# Patient Record
Sex: Female | Born: 1955
Health system: Southern US, Community
[De-identification: ages and names within clinical notes are randomized; demographics above are authoritative.]

## PROBLEM LIST (undated history)

## (undated) DIAGNOSIS — M21379 Foot drop, unspecified foot: Secondary | ICD-10-CM

## (undated) DIAGNOSIS — K5090 Crohn's disease, unspecified, without complications: Secondary | ICD-10-CM

## (undated) DIAGNOSIS — K859 Acute pancreatitis without necrosis or infection, unspecified: Secondary | ICD-10-CM

## (undated) DIAGNOSIS — M1990 Unspecified osteoarthritis, unspecified site: Secondary | ICD-10-CM

## (undated) DIAGNOSIS — G43909 Migraine, unspecified, not intractable, without status migrainosus: Secondary | ICD-10-CM

## (undated) DIAGNOSIS — B029 Zoster without complications: Secondary | ICD-10-CM

## (undated) DIAGNOSIS — Z7952 Long term (current) use of systemic steroids: Secondary | ICD-10-CM

## (undated) HISTORY — PX: KNEE SURGERY: SHX244

## (undated) HISTORY — PX: BOWEL RESECTION: SHX1257

## (undated) HISTORY — PX: OTHER SURGICAL HISTORY: SHX169

## (undated) HISTORY — PX: SHOULDER SURGERY: SHX246

## (undated) HISTORY — PX: COLONOSCOPY: SHX174

## (undated) HISTORY — DX: Zoster without complications: B02.9

---

## 1999-07-29 ENCOUNTER — Other Ambulatory Visit: Admission: RE | Admit: 1999-07-29 | Discharge: 1999-07-29 | Payer: Self-pay | Admitting: Gynecology

## 2000-03-17 ENCOUNTER — Other Ambulatory Visit: Admission: RE | Admit: 2000-03-17 | Discharge: 2000-03-17 | Payer: Self-pay | Admitting: Gynecology

## 2000-12-06 ENCOUNTER — Encounter: Payer: Self-pay | Admitting: Orthopedic Surgery

## 2000-12-09 ENCOUNTER — Inpatient Hospital Stay (HOSPITAL_COMMUNITY): Admission: RE | Admit: 2000-12-09 | Discharge: 2000-12-17 | Payer: Self-pay | Admitting: Orthopedic Surgery

## 2000-12-09 ENCOUNTER — Encounter: Payer: Self-pay | Admitting: Orthopedic Surgery

## 2001-02-28 ENCOUNTER — Encounter: Payer: Self-pay | Admitting: Orthopedic Surgery

## 2001-02-28 ENCOUNTER — Ambulatory Visit (HOSPITAL_COMMUNITY): Admission: RE | Admit: 2001-02-28 | Discharge: 2001-02-28 | Payer: Self-pay | Admitting: Orthopedic Surgery

## 2001-05-18 ENCOUNTER — Encounter: Admission: RE | Admit: 2001-05-18 | Discharge: 2001-05-18 | Payer: Self-pay | Admitting: Neurology

## 2001-05-18 ENCOUNTER — Encounter: Payer: Self-pay | Admitting: Neurology

## 2003-01-08 ENCOUNTER — Other Ambulatory Visit: Admission: RE | Admit: 2003-01-08 | Discharge: 2003-01-08 | Payer: Self-pay | Admitting: *Deleted

## 2003-04-13 ENCOUNTER — Encounter: Admission: RE | Admit: 2003-04-13 | Discharge: 2003-04-13 | Payer: Self-pay | Admitting: Rheumatology

## 2003-04-13 ENCOUNTER — Inpatient Hospital Stay (HOSPITAL_COMMUNITY): Admission: AD | Admit: 2003-04-13 | Discharge: 2003-04-18 | Payer: Self-pay | Admitting: Internal Medicine

## 2003-04-13 ENCOUNTER — Encounter: Payer: Self-pay | Admitting: Rheumatology

## 2003-04-14 ENCOUNTER — Encounter: Payer: Self-pay | Admitting: Internal Medicine

## 2003-05-17 ENCOUNTER — Ambulatory Visit (HOSPITAL_BASED_OUTPATIENT_CLINIC_OR_DEPARTMENT_OTHER): Admission: RE | Admit: 2003-05-17 | Discharge: 2003-05-17 | Payer: Self-pay | Admitting: Orthopedic Surgery

## 2003-05-17 ENCOUNTER — Encounter (INDEPENDENT_AMBULATORY_CARE_PROVIDER_SITE_OTHER): Payer: Self-pay | Admitting: *Deleted

## 2003-05-25 ENCOUNTER — Encounter: Admission: RE | Admit: 2003-05-25 | Discharge: 2003-05-25 | Payer: Self-pay | Admitting: Internal Medicine

## 2004-02-26 ENCOUNTER — Other Ambulatory Visit: Admission: RE | Admit: 2004-02-26 | Discharge: 2004-02-26 | Payer: Self-pay | Admitting: Obstetrics and Gynecology

## 2005-07-19 ENCOUNTER — Emergency Department (HOSPITAL_COMMUNITY): Admission: EM | Admit: 2005-07-19 | Discharge: 2005-07-19 | Payer: Self-pay | Admitting: Emergency Medicine

## 2006-01-19 ENCOUNTER — Other Ambulatory Visit: Admission: RE | Admit: 2006-01-19 | Discharge: 2006-01-19 | Payer: Self-pay | Admitting: Obstetrics and Gynecology

## 2006-06-07 ENCOUNTER — Inpatient Hospital Stay (HOSPITAL_COMMUNITY): Admission: RE | Admit: 2006-06-07 | Discharge: 2006-06-11 | Payer: Self-pay | Admitting: *Deleted

## 2006-11-17 ENCOUNTER — Observation Stay (HOSPITAL_COMMUNITY): Admission: EM | Admit: 2006-11-17 | Discharge: 2006-11-18 | Payer: Self-pay | Admitting: Emergency Medicine

## 2006-12-08 ENCOUNTER — Encounter: Admission: RE | Admit: 2006-12-08 | Discharge: 2006-12-08 | Payer: Self-pay | Admitting: Gastroenterology

## 2007-03-01 ENCOUNTER — Encounter: Admission: RE | Admit: 2007-03-01 | Discharge: 2007-03-01 | Payer: Self-pay | Admitting: Rheumatology

## 2007-08-14 ENCOUNTER — Emergency Department (HOSPITAL_COMMUNITY): Admission: EM | Admit: 2007-08-14 | Discharge: 2007-08-14 | Payer: Self-pay | Admitting: Emergency Medicine

## 2008-03-20 ENCOUNTER — Other Ambulatory Visit: Admission: RE | Admit: 2008-03-20 | Discharge: 2008-03-20 | Payer: Self-pay | Admitting: Obstetrics and Gynecology

## 2008-07-18 ENCOUNTER — Ambulatory Visit (HOSPITAL_COMMUNITY): Admission: RE | Admit: 2008-07-18 | Discharge: 2008-07-18 | Payer: Self-pay | Admitting: Obstetrics and Gynecology

## 2008-07-18 ENCOUNTER — Encounter (INDEPENDENT_AMBULATORY_CARE_PROVIDER_SITE_OTHER): Payer: Self-pay | Admitting: Obstetrics and Gynecology

## 2008-08-01 ENCOUNTER — Emergency Department (HOSPITAL_COMMUNITY): Admission: EM | Admit: 2008-08-01 | Discharge: 2008-08-01 | Payer: Self-pay | Admitting: Emergency Medicine

## 2008-09-21 ENCOUNTER — Encounter: Admission: RE | Admit: 2008-09-21 | Discharge: 2008-09-21 | Payer: Self-pay | Admitting: Orthopedic Surgery

## 2010-09-21 ENCOUNTER — Encounter: Payer: Self-pay | Admitting: Obstetrics and Gynecology

## 2011-01-13 NOTE — Op Note (Signed)
NAMEJORDIE, [name]              ACCOUNT NO.:  [id number]   MEDICAL RECORD NO.:  [id number]          PATIENT TYPE:  AMB   LOCATION:  SDC                           FACILITY:  [facility]   PHYSICIAN:  Charles A. Delcambre, MDDATE OF BIRTH:  01-22-1956   DATE OF PROCEDURE:  07/18/2008  DATE OF DISCHARGE:                               OPERATIVE REPORT   PREOPERATIVE DIAGNOSES:  1. Postmenopausal bleeding.  2. Endometrial mass.  3. Vulvar and thigh lesions.   POSTOPERATIVE DIAGNOSES:  1. Postmenopausal bleeding.  2. Endometrial mass.  3. Vulvar and thigh lesions.  4. Endometrial polyp.   PROCEDURES:  1. Hysteroscopy.  2. Dilation and curettage.  3. Polypectomy.  4. Paracervical block.  5. Vulvar lesion resection.  6. Mole resection, right upper thigh.   SURGEON:  Charles A. Delcambre, MD   ASSISTANT:  None.   COMPLICATIONS:  None.   ESTIMATED BLOOD LOSS:  25 mL.   ANESTHESIA:  General, via the endotracheal route.   Instrument, sponge, and needle counts were correct x2.   FINDINGS:  Multiple endometrial polyps on the right approximately 4 cm  down from the groin junction from the vulva, was an irregular dark mole  removed per my judgment after further evaluation in the OR.  There was a  firm inferior mons area lesion that was bothersome to her and this was  removed by plan.   DESCRIPTION OF PROCEDURE:  The patient was taken to the operating room  and placed in supine position.  General anesthetic was induced without  difficulty.  She was then placed in dorsal lithotomy position.  The  wound was sterilely prepped and draped.  A weighted speculum was placed  in the vagina.  Tenaculum was used on the cervix.  Paracervical block  with 16 mL divided equally.  Plain Marcaine 0.25% were placed at 4 and 8  o'clock.  There was no evidence of intravascular location of injection.  Sound was to 8 cm.  The uterus was anteverted.  Hanks dilators were used  to dilate up enough to get  5-mm hysteroscope in.  Findings were noted  above.  Hysteroscope was withdrawn.  Curettes were used to remove the  polyps and then with polyps forceps and then circumferential curetting  was done until the gritty feeling was throughout.  There was no evidence  of perforation.  Hysteroscopy was discontinued.  D&C had adequate  hemostasis after curetting.  The tenaculum was removed and there was a  sign of good hemostasis.  Attention was turned to the left mons area.  This area was shaved about a square centimeter and 1% lidocaine with  epinephrine 3 mL was injected subcutaneously.  The lesion was removed in  a circular fashion, then a 3-0 Prolene was placed in a single stitch to  achieve hemostasis.  Bacitracin ointment was placed on this area.  Attention was then turned to the right upper thigh, a half-moon-shaped  irregular pigmented lesion was resected after 3 mL of 1% lidocaine with  epinephrine were injected subcutaneously.  The 3-0 Prolene was used to  stitch the incised area  with good results.  Hemostasis on both sites  were excellent.  Band-Aid was applied on the thigh.  The patient was  awakened and taken to recovery with physician in attendance having  tolerated the procedure well.      Charles A. Sydnee Cabal, MD  Electronically Signed     CAD/MEDQ  D:  07/18/2008  T:  07/19/2008  Job:  [id number]

## 2011-01-13 NOTE — H&P (Signed)
[name], [name]              ACCOUNT NO.:  [id number]   MEDICAL RECORD NO.:  [id number]          PATIENT TYPE:  AMB   LOCATION:  SDC                           FACILITY:  [facility]   PHYSICIAN:  Charles A. Delcambre, MDDATE OF BIRTH:  12-14-1955   DATE OF ADMISSION:  DATE OF DISCHARGE:                              HISTORY & PHYSICAL   A 55 year old para 2-0-0-2 comes in today and was noted to have  irregular bleeding and I felt to be perimenopausal.  Ultrasound was done  and this showed a hyperechoic mass on the anterior wall of 5 x 4 mm,  total thickness was 9 mm.  Pap has been negative, July 2009.  She has an  endometrial biopsy in that we are going to go and do hysteroscopy, D&C  at the same time.   PAST MEDICAL HISTORY:  Spondyloarthropathy/Crohn disease, peripheral  joint disease, chronic back pain, monoclonal gammopathy, migraine  headaches.   SURGICAL HISTORY:  Bilateral hip replacement x2 each, 2 hip replacements  on the left, 2 hip replacements on the right for severe DJD,  paresthesias on the right inner and outer of her leg.  She wears a brace  in this regard with drop foot.  Arthroscopic knee and then open knee to  the right for arthritis in 1995, small bowel obstruction or fistula  resulting in 6 inches of segment of small bowel resected.   MEDICATIONS:  Darvocet for hip pain, Imitrex IM for headaches.   Crohn disease has been silent for the last 8-10 years.   ADDITIONAL MEDICAL HISTORY:  Opening cause pancreatitis.   ALLERGIES:  No known drug allergies.   SOCIAL HISTORY:  No tobacco, ethanol, or drug use or STD exposure.  The  patient is married, monogamous relationship with her husband.   FAMILY HISTORY:  Father 63, asthma and hypertension.  Mother 67 and did  not know if she is still living with uterine cancer.  She was felt to be  NED at that time and also had hypertension.   REVIEW OF SYSTEMS:  No fever, chills, rashes, lesions, headaches,  dizziness,  seasonal allergies, chest pain, shortness of breath,  wheezing, diarrhea, constipation, bleeding, melena, hematochezia,  urgency, frequency, dysuria, incontinence, hematuria, galactorrhea,  emotional changes.   PHYSICAL EXAMINATION:  GENERAL:  Alert and oriented x3.  VITAL SIGNS:  Blood pressure 130/90, respirations 20, pulse 76,  afebrile.  HEART:  Regular rate and rhythm.  LUNGS:  Clear bilaterally.  ABDOMEN: Soft, flat, nontender.  PELVIC:  Uterus nonenlarged.  Vulva and vagina normal.  Adnexal regions,  ovaries nonpalpable, but exam is nontender.   ASSESSMENT:  Perimenopausal irregular bleeding with endometrial mass on  sonohysterogram.   PLAN:  Hysteroscopy, D&C.  She accepts risks of infection, bleeding,  bowel and bladder damage, uterine perforation, failure to control  bleeding, blood product risk, hepatitis and HIV exposure.  We will plan  hysteroscopy, D&C on July 18, 2008.  She will remain n.p.o. past  0600.  Preop CBC, CMET, and serum pregnancy test will be done as well.      Charles A. Sydnee Cabal, MD  Electronically Signed     CAD/MEDQ  D:  07/06/2008  T:  07/07/2008  Job:  [id number]

## 2011-01-16 NOTE — Discharge Summary (Signed)
[place]  Patient:    [name], [name]                     MRN: [id number] Adm. Date:  [id number] Disc. Date: [id number] Attending:  Cornell Barman Dictator:   Jamelle Rushing, P.A.                           Discharge Summary  Over her course of time, she did continue to improve with her appetite and overall pain management and she did also have some slight initial improvement in the sensation about her foot.  Ultimately on date of discharge, her loss of sensation included her very forefoot and along her later aspect of her foot to her ankle.  She did have sensation in the plantar aspect of her foot and up into her arch and the very medial aspect of her ankle.  She still continued to have no motor abilities of her forefoot.  The patient has been having some intermittent aches and pains throughout her foot.  She has noted that she has has had some burning and tingling sensations down her lower leg into her foot when she is up and ambulating and she did note on the previous night that she was woke in the middle of the night with a sensation of a severe ingrown toenail of her right great toe.  Otherwise, there were no other complications or problems throughout this patients hospitalization and she was felt to be stable and ready to be discharged on postop day #8.  Arrangements were made for the patient to have a home health aide for activities of daily living. She was also to have a home health R.N. for protime draws.  LABORATORY DATA AND X-RAY FINDINGS:  On April 14, CBC with WBC 10.5, hemoglobin 9.7, hematocrit 28.1, platelets 263.  On April 18, PT 18.9 and 2.0 INR.  Routine chemistries on April 13, showed sodium 134, potassium 3.3, glucose 121, creatinine 0.4.  All other values normal.  Routine urinalysis on admission showed a few epithelial cells and a moderate amount of leukocyte esterase with no bacteria noted.  Routine cultures of the  hip during surgical procedure eventually had no WBCs or organisms noted and no growth after several days.  The patient did receive a total of two units of packed red blood cells.  EKG on admission was normal sinus rhythm with a first-degree AV block with right axis deviation, right ventricular hypertrophy with repolarization abnormality at 80 beats per minute.  DISCHARGE MEDICATIONS:  1. Colace 100 mg p.o. b.i.d.  2. Claritin 10 mg p.o. q.d.  3. Azulfidine 500 mg p.o. b.i.d.  4. Os-Cal 500 plus D two tablets p.o. q.d.  5. Potassium chloride 20 mEq p.o. b.i.d.  6. Topamax 300 mg p.o. q.h.s.  7. OxyContin 30 mg p.o. q.12h.  8. Robaxin 500 mg p.o. q.6-8h. p.r.n. spasms.  9. Coumadin 1 mg p.o. q.d. 10. Percocet one to two tablets every three to four hours p.r.n. breakthrough     pain.  SPECIAL INSTRUCTIONS:  Continue routine home medications OxyContin CR a total of 30 mg p.o. every 12 hours, Percocet one to two tablets every three to four hours for breakthrough pain if needed, Coumadin 1 mg tablet a day unless changed by [place] pharmacist.  Home health R.N. for protime.  Hospital bed. Home health physical therapy and occupational therapy to be held until increased  weightbearing status.  ACTIVITY:  Bed to chair only, nonweightbearing right leg.  May touchdown right leg to balance for shower with the use of a walker.  DIET:  No restrictions.  WOUND CARE:  Check daily for infection.  If any increased redness, swelling, pain or foul discharge, call physician of present.  FOLLOWUP:  The patient is to call for a follow-up appointment with Dr. Chaney Malling this following Wednesday.  CONDITION ON DISCHARGE:  Good. DD:  12/17/00 TD:  12/18/00 Job: [id number]

## 2011-01-16 NOTE — Op Note (Signed)
   NAMEBILLIE, [name]                        ACCOUNT NO.:  [id number]   MEDICAL RECORD NO.:  [id number]                   PATIENT TYPE:  AMB   LOCATION:  DSC                                  FACILITY:  [facility]   PHYSICIAN:  Rodney A. Chaney Malling, M.D.           DATE OF BIRTH:  April 12, 1956   DATE OF PROCEDURE:  05/17/2003  DATE OF DISCHARGE:                                 OPERATIVE REPORT   PREOPERATIVE DIAGNOSIS:  Osteomyelitis, right great toe.   POSTOPERATIVE DIAGNOSIS:  Osteomyelitis, right great toe.   OPERATION PERFORMED:  Incision and drainage of right great toe and  debridement of terminal phalanx.   SURGEON:  Lenard Galloway. Chaney Malling, M.D.   ANESTHESIA:  Local.   DESCRIPTION OF PROCEDURE:  The patient was placed on the operating table in  supine position with a pneumatic tourniquet about the right upper thigh.  The right lower extremity and foot was prepped with DuraPrep and draped out  in the usual manner.  The foot was then wrapped out with an Esmarch.  A U-  shaped incision made over the distal end of the right great toe.  This  started at the base of the nail on the medial side and extended to the base  of the nail on the lateral side at the level of the terminal phalanx.  Using  very careful sharp dissection and loupe magnification, the nail bed was  elevated off the bone and the plantar flap was dissected off the bone.  Excellent exposure of the terminal phalanx was achieved.  No significant  abnormality was apparent.  The bone did not appear to be soft or degraded.  Aerobic and anaerobic cultures were taken.  The terminal phalanx was then  amputated with a bone cutting rongeur.  Cultures were taken in the marrow  cavity for both aerobic and anaerobic cultures.  A portion of the adjacent  soft tissue was also sent of for aerobic cultures.  Bone was sent for  pathologic evaluation.  No obvious draining sinuses were seen.  The base of  the nail was removed.  No other  evidence of infection was seen.  The wound  was left wide open.  Sterile dressings were applied.  The patient was  started on Ancef until cultures are available to determine final antibiotic  treatment. The patient then returned to the recovery room in excellent  condition.  Technically, this procedure went extremely well.   DRAINS:  Wound left wide open and packed open.   COMPLICATIONS:  None.                                               Rodney A. Chaney Malling, M.D.    RAM/MEDQ  D:  05/17/2003  T:  05/17/2003  Job:  [id number]

## 2011-01-16 NOTE — H&P (Signed)
NAMETANZIE, ROTHSCHILD                        ACCOUNT NO.:  [id number]   MEDICAL RECORD NO.:  [id number]                   PATIENT TYPE:  INP   LOCATION:  5742                                 FACILITY:  [facility]   PHYSICIAN:  Georgann Housekeeper, M.D.                 DATE OF BIRTH:  05-07-1956   DATE OF ADMISSION:  04/13/2003  DATE OF DISCHARGE:                                HISTORY & PHYSICAL   CHIEF COMPLAINT:  Abdominal pain and nausea.   HISTORY OF PRESENT ILLNESS:  [name] is a 55 year old white female  patient of Dr. Lennox Pippins who has had, over the past five days, abdominal  pain in the upper quadrants and pain extending all the way down the right  portion of the back into the hip.  The pain in the lower back and hip has  resolved.  She had described her discomfort initially as an 8.  It has been  accompanied by fever as high as 102.  She presented to the office on  04/11/2003.  Examination at that time revealed no fever or abdominal pain.  The patient did have some chills.  An FUO workup was obtained with no source  found.  Decision was made to place her on doxycycline as she lives in a  wooded environment.  Fever never resolved despite around-the-clock Tylenol.  She was found to have a positive urine culture with greater than 100,000 E.  coli and was placed on Cipro today.  In the last 24 hours, the patient  developed worsening abdominal pain, mainly in the epigastric area.  She has  also developed nausea in the last few hours.  She had an abdominal  ultrasound done that showed mildly diffuse pancreatic enlargement with  questionable pancreatitis.  She is being admitted at this time.   REVIEW OF SYSTEMS:  Fever as high as 103 two days ago.  Temperature 101.5  this morning.  CHEST:  No pain or cough, no wheezing.  GI:  As above.  GU:  No UTI symptoms.  RECTAL:  No diarrhea.  Bowel movements have been regular.  EXTREMITIES:  No edema.   PAST MEDICAL HISTORY:  1.  Spondyloarthropathy, Crohn's disease.  2. Migraine headache.  3. Monoclonal gammopathy, uncertain significance.   PAST SURGICAL HISTORY:  1. History of tonsillectomy.  2. Surgery for resection of ileocolostomy and gastrostomy in 1994.  3. Surgery for abdominal adhesions in 1996.   SOCIAL HISTORY:  She is a nonsmoker, rarely drinks ETOH.   FAMILY HISTORY:  Diabetes and arthritis.   PHYSICAL EXAMINATION:  VITAL SIGNS:  Temperature in the office this morning  98.5, pulse 70, blood pressure 120/80, respirations 18.  GENERAL: Alert female patient, holding abdominal area and leaning forward in  the chair.  HEENT:  Eyes, ears, nose, and throat unremarkable.  NECK:  No JVD.  No carotid bruits auscultated.  HEART:  Normal S1 and  S2 without murmur or rub.  LUNGS:  Clear to auscultation.  ABDOMEN:  Soft and nondistended.  Bowel sounds are present in all four  quadrants.  She is tender in the epigastric area.  No obvious guarding or  rebounding.  GU: Deferred.  RECTAL:  Sphincter tone normal.  Stool is light brown.  EXTREMITIES:  No edema.  INTEGUMENT:  No rash.  NEUROLOGIC:  Nonfocal exam.   MEDICATIONS:  1. Topamax, ? dose daily.  2. Azulfidine 2 p.o. b.i.d.  3. Darvocet 1 p.o. t.i.d. p.r.n.   ALLERGIES:  No known drug allergies.   LABORATORY DATA:  Laboratory data from August 13, amylase 146, lipase 423,  BUN 5, creatinine 0.5, sodium 133, potassium 2.7, chloride 106, CO2 23,  calcium 9.1, total protein 7.1, albumin 3.6, total bilirubin 1.1, alkaline  phosphatase 92, AST 40, ALT 33.  WBC 7000.   Laboratory data from August 11 reveals hemoglobin 13.5, hematocrit 40.2, MCV  100.2, white cell count 9100 with neutrophils of 76.4%.  Urinalysis revealed  +2 leukocyte esterase, 5 to 10 white blood cells, rare red blood cells, and  moderate bacteria.  Urine cultures grew out greater than 100,000 E. coli.  Urine pregnancy test was negative.   IMPRESSION:  Abdominal pain, nausea.    PLAN:  Based on abdominal ultrasound, it seems this is most likely  pancreatis.  Since she develop significant nausea in the last few hours and  the abdominal pain has worsened, will admit.  Will make n.p.o. for now.  IV  fluids.  Phenergan and Demerol for pain. Will also obtain abdominal CT scan  and have GI follow.      Tamera C. Holli Humbles, M.D.    TCL/MEDQ  D:  04/13/2003  T:  04/14/2003  Job:  [id number]

## 2011-01-16 NOTE — Discharge Summary (Signed)
NAMEGIANAH, BATT              ACCOUNT NO.:  [id number]   MEDICAL RECORD NO.:  [id number]          PATIENT TYPE:  INP   LOCATION:  5159                         FACILITY:  [facility]   PHYSICIAN:  Thora Lance, M.D.  DATE OF BIRTH:  [birth year]   DATE OF ADMISSION:  06/07/2006  DATE OF DISCHARGE:  06/11/2006                                 DISCHARGE SUMMARY   This is a 55 year old female who presented with a complaint of nausea,  vomiting, and midabdominal pain the 4 days prior to admission.  Lab workup  showed lipase of 268.  Her abdominal ultrasound was negative for gallstones.   SIGNIFICANT PHYSICAL FINDINGS:  Temperature 98.6, heart rate 88, blood  pressure 130/70.  Abdomen had bowel sounds present, tender in the  epigastrium.  No rebound or guarding or hepatosplenomegaly.   LABORATORIES ON ADMISSION:  WBC 6.6, hemoglobin 11.0, platelets 223.  Chemistry:  Sodium 140, potassium 3.1, chloride 104, bicarbonate 29, glucose  113, BUN 2, creatinine 0.5, calcium 8.7, total protein 6.2, albumin 2.9, AST  30, ALT 40, alk phos 83, total bilirubin 0.5, amylase 160, lipase 312.  Urinalysis was negative.  Ultrasound of the abdomen was negative for  gallstones or other abnormality.   HOSPITAL COURSE:  The patient was admitted with acute pancreatitis of  unclear etiology.  She was seen by Dr. Madilyn Fireman of the GI service.  This was  the patient's second bout of pancreatitis, the other being in 2004.  She was  a modest drinker and had no evidence of biliary involvement.  She was  treated supportively with IV fluids, pain, medication, and antiemetics and  electrolyte repletion.  Her potassium was a little bit low and was repleted  and normal at discharge.  The patient had an upper endoscopy on October 09  which was normal.  She had a CT scan of the abdomen October 10 which showed  evidence of acute pancreatitis but no necrosis or pseudocyst or etiology for  a pancreatitis identified.  There was some  reactive lymphadenopathy.  The  patient's symptoms steadily improved, and she only had mild abdominal pain  without nausea or vomiting at discharge.  She was tolerating a low-fat diet  at that time.  The final impression of the GI series is pancreatitis,  question etiology, possibly secondary to the sulfa component of the  Azulfidine.  The Azulfidine was discontinued at admission and not restarted  at discharge.  The patient will follow up with Dr. Coral Spikes and Dr. Sherin Quarry  regarding further treatment of her arthritis and Crohn disease.   DISCHARGE DIAGNOSES:  1. Acute pancreatitis.  2. Crohn's disease.  3. Migraine headaches.  4. Spondyloarthropathy.  5. Monoclonal gammopathy.   PROCEDURES:  1. Esophagogastroduodenoscopy.  2. Abdominal ultrasound.  3. CT scan of the abdomen.   DISCHARGE MEDICATIONS:  1. Imitrex IM p.r.n.  2. Vitamins.  3. Vicodin 5/500 one or 2 q.6 p.r.n. pain.   DISPOSITION:  Discharged to home.   DIET:  Low fat diet.   ACTIVITY:  As tolerated.   FOLLOWUP:  In the next 2-4 weeks with Dr.  Levitin and as currently scheduled  with Sherin Quarry.           ______________________________  Thora Lance, M.D.     JJG/MEDQ  D:  06/11/2006  T:  06/11/2006  Job:  [id number]   cc:   Demetria Pore. Coral Spikes, M.D.  Tasia Catchings, M.D.  John C. Madilyn Fireman, M.D.

## 2011-01-16 NOTE — Op Note (Signed)
Jennifer, Burns              ACCOUNT NO.:  [id number]   MEDICAL RECORD NO.:  [id number]          PATIENT TYPE:  INP   LOCATION:  5159                         FACILITY:  [facility]   PHYSICIAN:  John C. Madilyn Fireman, M.D.    DATE OF BIRTH:  [birth year]   DATE OF PROCEDURE:  06/08/2006  DATE OF DISCHARGE:                                 OPERATIVE REPORT   INDICATIONS FOR PROCEDURE:  Second bout of pancreatitis of unknown etiology.  No evidence of alcohol or biliary etiology or any other specific  explanation.  The procedure is primarily to rule out duodenal Crohn's as she  has known Crohn's disease or the unlikely possibility of a penetrating  duodenal or gastric ulcer into the pancreas.   PROCEDURE:  The patient was placed in the left lateral decubitus position  and placed on the pulse monitor with continuous low-flow oxygen delivered by  nasal cannula.  She was sedated with 75 mcg of IV fentanyl and 6 mg of IV  Versed.  The Olympus video endoscope was advanced under direct vision into  the oropharynx and esophagus.  The esophagus was straight and of normal  caliber.  The squamocolumnar line was at 38 cm.  There was no visible hiatal  hernia, ring, stricture or other abnormality of the GE junction.  The  stomach was entered, and a small amount of liquid secretions were suctioned  from the fundus.  Retroflexed view of cardia was unremarkable.  The fundus,  body, antrum and pylorus all appeared normal.  The duodenum was entered.  Both bulb and second portion were well inspected and appeared to be within  normal limits.  The papilla of Vater was seen tangentially and appeared  normal.  There was clear amber bile in the vicinity.  The scope was then  withdrawn and the patient returned to the recovery room in stable condition.  She tolerated the procedure well.  There were no immediate complications.   IMPRESSION:  Totally normal study.   PLAN:  Will obtain an abdominal CT scan to assess  severity of pancreatitis  and rule out neoplasm, pseudocyst,  etc.           ______________________________  Everardo All. Madilyn Fireman, M.D.     JCH/MEDQ  D:  06/08/2006  T:  06/10/2006  Job:  [id number]   cc:   Demetria Pore. Coral Spikes, M.D.

## 2011-01-16 NOTE — Discharge Summary (Signed)
[name], [name]              ACCOUNT NO.:  [id number]   MEDICAL RECORD NO.:  [id number]          PATIENT TYPE:  INP   LOCATION:  5714                         FACILITY:  [facility]   PHYSICIAN:  Michelene Gardener, MD    DATE OF BIRTH:  [birth year]   DATE OF ADMISSION:  11/16/2006  DATE OF DISCHARGE:  11/18/2006                               DISCHARGE SUMMARY   DISCHARGE DIAGNOSIS:  1. Crohn's disease.  2. Hypokalemia.  3. Urinary tract infection.  4. Headache.   DISCHARGE MEDICATIONS:  The patient was not given medications because he  left against medical advice.   CONSULTATIONS:  None.   PROCEDURES:  None.   FOLLOW-UP APPOINTMENTS:  He was advised to follow up with his primary  physician.   COURSE OF HOSPITALIZATION:  This is 55 year old female who presented with  increasing abdominal pain and was admitted to the hospital for  evaluation of Crohn's disease.  The patient was kept n.p.o. and started  on IV fluids and pain medications.  Also started on antiemetics p.r.n.  GI consult was requested with low Dr. Sherin Quarry, who has been seeing the  patient as an outpatient.  The patient also had potassium of 3.2, which  was supplemented by potassium chloride.  Urine cultures were sent for  urinary tract infection, and the patient was given antibiotics for that.  The next day of admission, the patient insisted on going home. The risk  of leaving was explained to him.  The patient insisted on going, so he  signed against medical advice.   ASSESSMENT TIME:  40 minutes.      Michelene Gardener, MD  Electronically Signed     NAE/MEDQ  D:  03/09/2007  T:  03/10/2007  Job:  [id number]

## 2011-01-16 NOTE — H&P (Signed)
[name], CHRISTON              ACCOUNT NO.:  [id number]   MEDICAL RECORD NO.:  [id number]          PATIENT TYPE:  INP   LOCATION:  5159                         FACILITY:  [facility]   PHYSICIAN:  Thora Lance, M.D.  DATE OF BIRTH:  1956/04/01   DATE OF ADMISSION:  06/07/2006  DATE OF DISCHARGE:                                HISTORY & PHYSICAL   This is a 55 year old white female with a complaint of nausea, vomiting, and  middle abdominal pain radiating to the right upper quadrant since four days  ago.  Now it started as low back pain.   HISTORY OF PRESENT ILLNESS:  The patient was ill with the above symptoms  since Friday.  She was brought in for evaluation.  Lab work revealed a  lipase of 268 with a negative abdominal ultrasound for gallstones.  The  decision was to admit because of persistent nausea and vomiting.  She does  have a history of pancreatitis in 2004, with a negative ultrasound of the  gallbladder at that time.  It was felt they could not do an MRCP because of  her hip replacements.   PAST MEDICAL HISTORY:  1. Acute pancreatitis of uncertain etiology with a negative ultrasound of      the gallbladder, calcium and triglycerides normal as well in 2004,      taken care of by Dr. Sherin Quarry.  2. Crohn's disease with status post terminal ileal resection in 1994.  3. Hypokalemia.  4. Migraine headache.  5. Spondyloarthropathy on Azulfidine.  6. Monoclonal gammopathy.   DRUG ALLERGIES:  NKDA.   CURRENT MEDICATIONS:  1. Azulfidine 1,000 mg twice a day.  2. Multivitamin one a day.  3. Phenergan 25 mg tablet as needed.  4. Imitrex shot for migraine headache.   SURGERIES:  1. Colon resection due to Crohn's in 1994.  2. Bilateral hip replacements.  3. Arthroscopic surgery of both knees.   SOCIAL HISTORY:  No smoking or alcohol.   REVIEW OF SYSTEMS:  No chest pain, shortness of breath, cough, sputum  production.  She does have fever, epigastric and abdominal pain  radiating to  the right upper quadrant, nausea, no vomiting.   PHYSICAL EXAMINATION:  VITAL SIGNS:  WT 143, T 98.6, P 88, BP 130/70.  GENERAL:  Acutely ill.  SKIN:  Nonicteric.  HEENT:  Mouth:  Mildly dry mucous membranes.  NECK:  Supple with no masses or adenopathy or goiter.  HEART:  Regular rhythm with no murmurs, rubs, or gallops.  LUNGS:  Clear.  ABDOMEN:  Bowel sounds present all four quadrants.  Soft.  She is tender in  the epigastrium that radiates to the right upper quadrant with no rebound,  guarding, or hepatosplenomegaly.  She does have a midline surgical scar from  a previous surgery.  EXTREMITIES:  She has trace edema in her ankles.   ASSESSMENT:  Recurring pancreatitis, last in 2004 with unclear etiology.   PLAN:  Admit for further evaluation and treatment.  Dr. Sherin Quarry wanted to  consider ERCP for recurring symptoms to check for a congenital ductal  abnormality.  __________  consult with Dr. Kirby Funk.      Lavinia Sharps, N.P.    ______________________________  Thora Lance, M.D.    MAP/MEDQ  D:  06/07/2006  T:  06/08/2006  Job:  [id number]

## 2011-01-16 NOTE — H&P (Signed)
NAMEKATALEYAH, CARDUCCI NO.:  [id number]   MEDICAL RECORD NO.:  [id number]          PATIENT TYPE:  EMS   LOCATION:  MAJO                         FACILITY:  [facility]   PHYSICIAN:  Lucita Ferrara, MD         DATE OF BIRTH:  1956-02-15   DATE OF ADMISSION:  11/16/2006  DATE OF DISCHARGE:                              HISTORY & PHYSICAL   PRIMARY CARE DOCTOR:  Dr. Coral Spikes from [facility] at [place]  office.   GASTROENTEROLOGIST:  Dr. Tasia Catchings.   HISTORY OF PRESENT ILLNESS:  The patient is a 55 year old female with  past medical history significant for Crohn's colitis, migraine headaches  and pancreatitis.  The patient presents with chief complaint of severe  abdominal pain and cramping for the last week that is getting  progressively worse.  The patient states that the pain has not improved.  Pain has resulted in some nausea and vomiting.  The patient also feels  feverish, although fever is subjective, patient afebrile here.  Relevant  to the history of present illness, the patient had a history of  recurrent pancreatitis thought to be secondary to sulfa medications  which the patient was taking for Crohn's colitis; the patient has  stopped that medication.  She states that her symptoms currently are  similar to a previous episode of pancreatitis.  The patient denies any  other relevant review of systems, denies any cardiac-related chest pain,  shortness of breath or peripheral edema.  No focal neurological  symptoms.   The patient had colon resection due to Crohn's disease in 1994.   PAST MEDICAL HISTORY:  1. Crohn's colitis.  2. Migraine headaches.  3. Recurrent pancreatitis; etiology of pancreatitis is unknown and it      was not thought to be secondary to gallbladder disease, calcium or      triglycerides.  4. History of hypokalemia.  5. History of spondyloarthropathy.  6. History of monoclonal gammopathy.   PAST SURGICAL HISTORY:  1. Colon  resection due to Crohn's disease in 1994.  2. Bilateral hip replacements.  3. Arthroscopic surgery of both knees.   ALLERGIES:  No known drug allergies.   CURRENT MEDICATIONS:  1. Darvocet-N 100 one tab q.6 h. p.r.n.  2. Imitrex shot for migraine headaches p.r.n.  3. Phenergan 25 mg as needed.  4. Multivitamins as needed.   PHYSICAL EXAMINATION:  GENERAL:  The patient looks acutely ill.  She is  in no acute distress or respiratory distress.  VITAL SIGNS:  Blood pressure is 126/63, pulse is 86, respirations 18 and  pulse oximetry 100% on room air.  HEENT:  Normocephalic and atraumatic.  Sclerae anicteric.  Mucous  membranes are moist.  NECK:  Supple.  No JVD.  No carotid bruits.  HEART:  S1 and S2.  Regular rate and rhythm.  No murmurs, rubs or  clicks.  LUNGS:  Clear to auscultation bilaterally.  No rhonchi, wheezes or  rales.  ABDOMEN:  Bowel sounds present in all 4 quadrants.  Tender in  epigastrium that radiates to the right upper quadrant.  No rebound  guarding.  No hepatosplenomegaly.  There is a midline surgical scar from  previous surgery.  EXTREMITIES:  No clubbing, cyanosis or edema.  NEUROLOGIC:  Alert and oriented x3.  Cranial nerves II-XII grossly  intact.   LABORATORY DATA:  White count 11.7, hemoglobin 14.7, hematocrit 42.3,  platelets 266,000.  Sodium 136, potassium 2.2, CO2 17, BUN 6, creatinine  0.54, lipase 24.   CT of the abdomen shows mucosal wall thickening and with mild  inflammatory changes consistent with active Crohn's disease changes in  the distal ileum, small amount of free fluid.   ASSESSMENT AND PLAN:  This is a 55 year old female with active Crohn's  exacerbation.  The patient will be admitted to the hospital for  intravenous hydration and pain management.  Gastroenterology will be  consulted.  Pain will be controlled with Dilaudid every 4-6 hours p.r.n.  Electrolytes and potassium will be repleted.  I am going to start Reglan  or Zofran for  nausea and vomiting.  The patient is hemodynamically  stable.  The plans and procedure of this admission have been explained  to the patient and the patient understands.      Lucita Ferrara, MD  Electronically Signed     RR/MEDQ  D:  11/17/2006  T:  11/17/2006  Job:  [id number]

## 2011-01-16 NOTE — Op Note (Signed)
[place]. [facility]  Patient:    [name], [name]                     MRN: [id number] Proc. Date: 12/09/00 Adm. 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  OPERATION PERFORMED:  Revision of acetabulum of right hip with ____________ type bone graft with femoral head fixed with 4 mm cannulated screws, deep profile acetabular solution system shell, 64 mm outside diameter, 54 mm liner with a 64 mm pinnacle sector acetabular cup with a Duraloc constrained liner and Solutions stem screws around periphery of constrained liner, three screws.  SURGEON:  Lenard Galloway. Chaney Malling, M.D.  ASSISTANT: 1. Claude Manges. Cleophas Dunker, M.D. 2. Jamelle Rushing, P.A.  ANESTHESIA:  General.  DESCRIPTION OF PROCEDURE:  After satisfactory oral endotracheal anesthesia, the patient was placed on the operating table in full lateral position with the right hip up.  The right lower extremity was prepped with DuraPrep and draped out in the usual manner.  Incision made through the previous Southern incision.  Skin edges were retracted.  Bleeders were coagulated.  Tensor fascia lata was split.  With the hip internally rotated, posterior aspect of the hip was approached.  Scar tissue over the posterior aspect of the hip was carefully lifted up.  The hip joint itself was opened and clear-appearing fluid was seen.  Cultures were taken.  There was a great deal of localized synovitis and scar tissue and this was excised.  This gave excellent access to the bipolar.  The bipolar hip could be easily dislocated.  There was a fair amount of fibrous tissue in the acetabulum.  This was then stripped out with a series of curets.  There was a large  defect in the posterior acetabular wall. Inspection of the proximal femoral component showed the femoral component to be fixed very firmly with excellent bony ingrowth.  This was a fixed head femoral component.  The acetabulum was reamed up to a 62 mm outside diameter and a pore-coated acetabular cup was inserted.  Trials were inserted.  The hip was unstable.  Forward flexioning was difficult to achieve because of a large defect posteriorly and it was felt that a ____________ type femoral head graft was necessary to build up to the posterior acetabular wall.  The femoral head was obtained from the bone bank.  The graft was then fashioned with a saw.  The graft was then inserted into the posterior acetabular lip and seated very nicely in the posterior acetabulum.  This reconstructed the posterior wall beautifully.  Drill holes were made and two 4 mm cannulated screws were used to fix to the pelvis itself.  Excellent fixation was achieved.  Another portion of the femoral head was then placed slightly distally and fixed to the acetabulum with another cannulated 4 mm screw.  Excellent fixation of this graft was also achieved.  The reamer was then reinserted in the acetabulum and the ____________ graft was contoured to accept a cup.  At this point it was felt that the ____________ acetabular cup which would displace the hip laterally to add to stability.  His neck length  on the femoral component was fixed and unable to be changed.  The ____________ acetabulum was put in place and a trial liner was inserted.  Several positions were used and ____________ forward flexion and abduction and fairly good stability was achieved.  Bone graft which had been ground up was then placed medially in the acetabulum. The ____________  acetabular cup was then put back in place and affixed with three peripheral screws.  Fairly good fixation was achieved.  The trial liner was then reinserted in the acetabulum and  the hip was reduced once again.  In full flexion, better than 90 degrees and full abduction and internal rotation this tended to slide out and it was felt that a constrained liner was necessary to prevent dislocation.  Throughout the procedure, the hip was irrigated with copious amounts of antibiotic solution.  The constrained liner was then inserted in the ____________ acetabular cup.  The ring had been placed around the neck of the femur.  Once the liner was reduced, the head was reduced in the poly liner and the ring was snapped through it in place. Excellent stability was achieved.  Good fixation of the acetabular cup was achieved.  Excellent positioning and capture by the posterior ____________ bone grafts, was very nice.  The entire posterior acetabular wall was reconstructed very nicely.  Hemovac drain was then placed in the wound. Posterior hip rotators were reattached.  Tensor fascia lata was closed with heavy Tycron sutures.  Vicryl was used to close the subcutaneous tissues and stainless steel staples used to close the skin.  This was an extremely complex extended procedure.  Technically, this went extremely well.  COMPLICATIONS:  None. DD:  12/09/00 TD:  12/09/00 Job: [id number]

## 2011-01-16 NOTE — Discharge Summary (Signed)
[place]. [facility]  Patient:    [name], [name]                     MRN: [id number] Adm. Date:  [id number] Disc. Date: [id number] Attending:  Cornell Barman Dictator:   Jamelle Rushing, P.A.                           Discharge Summary  ADMISSION DIAGNOSES: 1. Right hip bipolar prosthesis with protrusio acetabulum. 2. Crohns disease. 3. History of migraines.  DISCHARGE DIAGNOSES: 1. Right hip revision for protrusio acetabulum and bipolar prosthesis to total    hip arthroplasty. 2. Sciatic nerve right hip palsy. 3. Crohns disease. 4. History of migraines.  HISTORY OF PRESENT ILLNESS:  This is a 55 year old white female with a history of bilateral total hip arthroplasties in the past for significant problems with bilateral hip arthritis.  The patient has developed moderate to severe right hip pain over the last year.  It has significantly worsened over the last four months.  The patient describes the pain as moderate to severe sharp pain with ambulation that worsens going up hills, up and down stairs, and long-term ambulation.  It is described primarily as a deep pain within her groin.  It does not radiate anywhere.  She does not find anything that significant improves her discomfort.  X-rays in the office reveal a bipolar arthroplasty with protrusio acetabulum.  ALLERGIES:  None.  CURRENT MEDICATIONS: 1. Azulfidine 500 mg p.o. b.i.d. 2. Darvocet p.r.n. 3. Phenergan 25 mg q.6h. p.r.n. 4. Topamax 100 mg p.o. q.h.s. 5. Fiorinal one tablet p.o. q.4h. p.r.n. 6. Zyrtec 100 mg p.o. q.d. 7. Caltrate 600 mg plus soy b.i.d. 8. Zomig 5 mg p.o. p.r.n.  SURGICAL PROCEDURES:  On December 09, 2000, the patient was taken to the OR by Thereasa Distance A. Chaney Malling, M.D.  Assistants were Claude Manges. Cleophas Dunker, M.D., and Jamelle Rushing, P.A.  Under general anesthesia, the operation performed was revision of the acetabulum of her right hip with donor-type bone graft  with femoral head fixated with 4 mm cannulated screws, deep profile acetabular solution system shell, 64 mm outside diameter, 54 mm liner with a 64 mm pinnacle sector acetabular cup with a Duraloc constraint and liner, and solution stem screws around the periphery of constrained liner with three screws.  Technically the procedure went very well.  The patient tolerated the procedure well with the exception of developing a sciatic palsy postoperatively, which was documented upon arrival in the recovery unit.  COMPLICATIONS:  Sciatic nerve palsy, right hip, with loss of sensation and motor from glove-type pattern from the ankle on down.  CONSULTS:  Routine consults for case management and pharmacy for Coumadin dosing for DVT prophylaxis.  HOSPITAL COURSE:  On December 09, 2000, the patient was admitted to [facility] under the care of [place] A. Chaney Malling, M.D., and Cammy Copa, M.D.  The patient underwent a right hip revision for acetabulum protrusio and conversion of bipolar to a total hip arthroplasty.  The patient did develop a right sciatic palsy which was noted upon leaving the OR suite and arrival at the recovery unit.  The patient did have a glove-like loss of sensation and motor activity from the ankle down on her right.  [place] A. Chaney Malling, M.D., and Claude Manges. Cleophas Dunker, M.D., both evaluated the patient. Dr. Chaney Malling had a discussion with Genene Churn. Love, M.D., and  it was felt that it was a possible stretch injury to the sciatic nerve which incurred intraoperatively.  The plan was to watch and wait and observe and monitor over its course.  The patients weightbearing status due to the extensive repair of her acetabulum and the use of femoral head bone graft and a constraint liner, it was felt that the patient should remain nonweightbearing and bed to chair elevation only for a possible four weeks to allow both bone growth and solidification of the  acetabulum.  The patient then had an eight-day postoperative course in which she had several minor issues that we dealt with.  Initially the patients INR did become hypertherapeutic to 4.5 on December 12, 2000, so she was held on Coumadin for four days and on December 16, 2000, her INR returned at 2 and the pharmacy was going to resume the Coumadin at 1 mg a day and continue to monitor.  No significant untoward effects due to this high INR.  The other problem was the patients significant problem with pain down the right lower extremity.  The patient initially was managed with high-dose PCA morphine, but she was converted over to OxyContin CR and Percocet for breakthrough pain and then initially weaned off of intermittent IV doses of morphine for rescue pain management.  On the day of discharge, the patient was virtually completely eliminated for any need of extra IV morphine, but she was at OxyContin CR 30 mg q.12h. and pretty much Percocet every four hours around the clock.  The patient also developed a significant amount of loss of appetite, but this did gradually improve over several days as she did increase her activity from bed to chair and did find other foods other than what is offered on the menu more appetizing.  The patient also did have some postoperative blood loss anemia. On December 10, 2000, her hemoglobin was 7.0 with a hematocrit of 20.4.  She was typed and crossed two units of packed red blood cells and transfused to 9.5 and 27.1, respectively.  Other than looking very pale, she had no other real significant symptoms other than DD:  12/17/00 TD:  12/18/00 Job: [id number]

## 2011-01-16 NOTE — Consult Note (Signed)
[name], [name]              ACCOUNT NO.:  [id number]   MEDICAL RECORD NO.:  [id number]          PATIENT TYPE:  INP   LOCATION:  5159                         FACILITY:  [facility]   PHYSICIAN:  John C. Madilyn Fireman, M.D.    DATE OF BIRTH:  1955-09-19   DATE OF CONSULTATION:  06/07/2006  DATE OF DISCHARGE:                                   CONSULTATION   We were asked to do a consult to evaluate the patient for pancreatitis to  see if she needs an ERCP by Dr. Demetria Pore. Levitin on June 07, 2006.   HISTORY OF PRESENT ILLNESS:  This patient complains of epigastric abdominal  pain that radiates to the right along with nausea and vomiting times 4 days.  She also has diarrhea that started this morning.  She denies melena,  hematochezia, hematemesis.  She reports that she had one martini Friday  night but usually drinks very little.  She occasionally takes Zyprexa to  sleep.  Other than that the only meds she uses are Azulfidine, Phenergan,  Imitrex and Darvocet.  She reports that she stopped taking Topamax 30 days  ago.  She had her initial episode of pancreatitis in 2004.  Amylase at that  time was 146.  Lipase was 423.  She had normal AST, ALT and in the interim  she has had normal calcium, normal triglycerides and normal LFTs.  This is  the second episode of pancreatitis.  Her lipase is currently 268.  She had  an ultrasound done today that was negative for any gallstones.  This patient  is unable to undergo MRCP due to hip placement bilaterally.   PAST MEDICAL HISTORY:  She had acute pancreatitis of unknown etiology in  2004.  She has Crohn's disease, status post terminal ileal resection in  1994.  Her Crohn's is well controlled with Azulfidine.  She has a history of  hypokalemia.  She has history of migraine headaches, says she gets 2-3 per  month.  History of spondyloarthropathy, and finally history of monoclonal  gammopathy.   SHE HAS NO KNOWN DRUG ALLERGIES.   CURRENT MEDICATIONS:  1. Azulfidine 500 mg q.i.d.  2. Imitrex IM.  3. MVI.  4. Phenergan 25 mg p.r.n.  5. Zyprexa p.r.n. insomnia.  6. Topamax which she stopped 30 days ago.   SURGERIES:  Terminal ileal resection in 1994, bilateral knee arthroscopy,  and bilateral hip replacement.   FAMILY HISTORY:  Negative for liver, pancreatic and colon cancer.   SOCIAL HISTORY:  She is a nonsmoker.  She drinks alcohol occasionally,  reports approximately 1 drink per month.  She lives with her husband.  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   REVIEW OF SYSTEMS:  Significant for no recent illness or fever.  No  weakness.  She does report that she has intermittent edema in her ankles,  and she has been more fatigued recently than usual.   PHYSICAL EXAMINATION:  She is alert, oriented.  She appears in pain in her  head and her abdomen.  Cardiovascular system has a regular rate and rhythm  with no murmurs, rubs or gallops appreciated.  Her lungs are clear to  auscultation bilaterally with no wheezes, crackles or rales appreciated.  Her neck has no adenopathy, no thyromegaly.  Her abdomen is soft, nontender  with decreased bowel sounds, no masses, no bruits, and it is tender in the  right upper quadrant to deep palpation.  Her extremities show no edema.   IMPRESSION:  She has an increased lipase with abdominal pain, nausea and  vomiting times 4 days.  She has received 4 mg of morphine sulfate.  Labs  been ordered for a.m.  She is currently n.p.o. on IV fluids.   Dr. Madilyn Fireman has seen and examined the patient and reviewed the chart.  He  believes this is the second bout of pancreatitis with the etiology unclear.  Reports gallbladder was negative for gallstones.  His differential diagnosis  includes alcohol as well as pancreas divisum, penetrating ulcer as well as  Azulfidine which is a sulfa drug.  His plan of care:  He will await all  admission labs, consider EGD, ERCP, stopping Azulfidine.  He will also  consider  abdominal CT.      Stephani Police, PA    ______________________________  Everardo All Madilyn Fireman, M.D.    MLY/MEDQ  D:  06/07/2006  T:  06/09/2006  Job:  [id number]   cc:   Demetria Pore. Coral Spikes, M.D.  Tasia Catchings, M.D.  John C. Madilyn Fireman, M.D.

## 2011-01-16 NOTE — Discharge Summary (Signed)
[name]                        ACCOUNT NO.:  [id number]   MEDICAL RECORD NO.:  [id number]                   PATIENT TYPE:  INP   LOCATION:  5742                                 FACILITY:  [facility]   PHYSICIAN:  Tasia Catchings, M.D.                DATE OF BIRTH:  21-Jul-1956   DATE OF ADMISSION:  04/13/2003  DATE OF DISCHARGE:  04/18/2003                                 DISCHARGE SUMMARY   DISCHARGE DIAGNOSES:  1. Acute pancreatitis of uncertain etiology, ultrasound of gallbladder     negative and calcium, triglycerides also normal.  2. Crohn's disease status post terminal ileal resection 1994.  3. Hypokalemia.  4. Migraine headaches.  5. Spondyloarthropathy.  6. Monoclonal gammopathy.   DISCHARGE MEDICATIONS:  1. Azulfidine 500 mg four tablets daily.  2. Zyrtec one tablet daily.  3. Topamax 300 mg at bedtime.  4. Darvocet and Fioricet as needed.  5. Zomig as needed.  6. Folic acid 1 mg daily.   PAIN MANAGEMENT:  With Darvocet and Fioricet.   ACTIVITY:  The patient is to take it easy but to be up as tolerated.   DIET:  Low fat.   WOUND CARE:  Not applicable.   CONDITION:  Improved.   FOLLOW UP:  Followup is with Dr. Coral Spikes in one week and Dr. Merleen Milliner in  four weeks.   BRIEF HISTORY:  [name] is a 55 year old female admitted by Dr.  Coral Spikes and Dr. Donette Larry after three days of abdominal pain. Evaluation in the  office included a normal set of liver set of liver functions and a normal  CBC but an elevated amylase and lipase. An ultrasound done as an outpatient  revealed some edema of the head of the pancreas but showed a normal  gallbladder. The patient was admitted when she was developed nausea and  anorexia.   PHYSICAL EXAMINATION:  GENERAL:  Physical exam revealed tenderness in the  epigastrium. Bowel sounds were normal; however, there was no rebound and no  evidence physically of obstruction. She also has bilateral hip replacements  and  decrease range of motion in hip joints bilaterally.   LABORATORY DATA:  While in the hospital, the patient had initially a serum  potassium of 2.8. After replacement, this improved to 3.7 and remained  stable at 3.5. Her albumin was also minimally reduced at 2.9. Her liver  function studies were normal. Her lipase initially was 373 with an amylase  of 137. An amylase subsequently returned to normal and lipase was on its way  towards normal. CBC revealed a hemoglobin of 10.3, white count was 4.0, and  these did not change while patient was in the hospital. Her MCV was 97.2.   RADIOLOGIC FINDINGS:  The patient underwent a CT scan of the abdomen which  again revealed edema of the head of the pancreas but was otherwise  unremarkable. Pelvic CT scan was difficult due to  her hip replacements. The  gallbladder was again normal, but this is not a particularly good test for  looking at small gallstones unless they are calcified.   HOSPITAL COURSE:  1. Pancreatitis. The patient was initially placed on IV fluids and NPO.     Gradually, her abdominal pain improved about the same time as her amylase     and lipase returned to normal. She had no nausea or vomiting, and near     the end of her hospitalization, her appetite returned. She was placed     initially on clear liquids and then for 24 hours on a low-fat diet     without any increase in abdominal pain or return of nausea and vomiting.     Etiology of her pancreatitis remains obscure in that she apparently has     no gallstones and normal liver functions. She also does not drink alcohol     by history and has had normal triglycerides and minimally low calcium     consistent with a low albumin. She cannot tolerate a MRCP because of hip     replacements and does not at this point qualify for an ERCP unless she     has a second attack.  2. Hypokalemia. The patient was given IV and p.o. potassium with a return of     her potassium to normal.  3.  Migraine headaches. The patient did require the Imitrex since the     hospital does not have Zomig on its formulary which helped and restarting     of her Topamax definitely helped.  4. Crohn's disease. The patient did have diarrhea in the hospital. Prior to     her admission, she was taken off the Azulfidine, and that was not     restarted while she was in the hospital but clearly was helping not only     her joints but also her Crohn's disease and will be restarted with the     addition of folic acid upon discharge.                                                Tasia Catchings, M.D.    JW/MEDQ  D:  04/18/2003  T:  04/19/2003  Job:  [id number]

## 2011-01-16 NOTE — H&P (Signed)
[place]  Patient:    [name], [name]                    MRN: [id number] Adm. Date:  12/09/00 Attending:  Thereasa Distance A. Chaney Malling, M.D. Dictator:   Jamelle Rushing, P.A.                         History and Physical  DATE OF BIRTH: [birth year]  CHIEF COMPLAINT: Recurrent right hip pain for the last year.  HISTORY OF PRESENT ILLNESS: The patient is a 55 year old white female with a history of bilateral total hip arthroplasty in the past for significant problems with bilateral hip arthritis.  The patient has developed moderate to severe right hip pain over the last year and it has significantly worsened over the last four months.  The patient describes the pain as a moderate to severe sharp pain with ambulation and it worsens when going up hills, up and down stairs, and for long-term ambulation.  She describes it as primarily deep within her groin region.  It does not specifically radiate anywhere.  She does not find anything that significantly improves it.  X-rays in the office reveal a bipolar arthroplasty with protrusio acetabulum.  ALLERGIES: None.  CURRENT MEDICATIONS:  1. Azulfidine 500 mg b.i.d.  2. Darvocet p.r.n.  3. Phenergan 25 mg q.6h p.r.n.  4. Topamax 100 mg p.o. t.i.d.  5. Fiorinal one tablet q.4h p.r.n.  6. Zyrtec 100 mg p.o. q.d.  7. Caltrate 600 + soy b.i.d.  8. Zomig 5 mg p.o. p.r.n.  PAST MEDICAL HISTORY:  1. The patient has been diagnosed with Crohns disease.  2. Osteoarthritis of bilateral hips.  3. Occasional problems with migraines.  Otherwise, the patient denies any other significant medical history such as diabetes, hypertension, thyroid disease, hiatal hernia, peptic ulcer, heart disease, or asthma.  PAST SURGICAL HISTORY:  1. Tonsillectomy in 1981.  2. Hip replacement in 1983, 1986, and 1985.  3. Intestinal resection in 1994.  4. Arthroscopy of knees in 1982 and 1984.  The patient denies any history  of complications with the above mentioned surgical procedures.  SOCIAL HISTORY: The patient is a 54 year old white female.  She denies smoking.  She does admit to occasional alcoholic beverages.  She is married and lives with her husband in a [place] house.  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  FAMILY PHYSICIAN: Demetria Pore. Coral Spikes, M.D. [phone]).  FAMILY HISTORY: Both mother and father are alive.  Mother has diabetes mellitus at 97 years of age.  Father has hypertension at 29 years of age.  She has one sister in good health.  REVIEW OF SYSTEMS: Positive for only reading glasses, otherwise in all other categories is negative and noncontributory.  PHYSICAL EXAMINATION:  VITAL SIGNS: Pulse 88, respirations 14, temperature 99.0 degrees, blood pressure 120/84.  Height 5 feet even.  Weight 145 pounds.  GENERAL: This is a healthy-appearing, well-developed, very pleasant female. She is able to get on and off the examining table with just a little bit of outward discomfort.  HEENT: Head normocephalic, atraumatic.  Nontender over maxillary or frontal sinuses.  PERRLA.  EOMI. Sclerae nonicteric.  Conjunctivae pink and moist. External ears without deformities, canals patent, TMs pearly gray and intact. Gross hearing is intact.  Nasal septum midline.  Mucous membranes pink and moist with no polyps noted.  Oral buccal mucosa was pink and moist  with no lesions.  Dentition was in good repair.  The patient is able to swallow without any difficulty.  NECK: Supple.  No palpable lymphadenopathy.  Thyroid gland nontender.  The patient had good range of motion of her cervical without any difficulty or tenderness.  CHEST: Lungs sounds were clear and equal bilaterally.  No wheezes, rales, rhonchi, or rubs noted.  HEART: Regular rate and rhythm of S1 and S2 auscultated.  No murmurs, rubs, or gallops noted.  ABDOMEN: Flat, nontender.  Bowel sounds were present throughout and  were normoactive.  No hepatosplenomegaly.  CVA nontender to percussion.  EXTREMITIES: Upper extremities were symmetric size and shape, with full range of motion of her shoulders, elbows, and wrists without any difficulty.  Motor strength is 5/5 in all muscle groups tested.  Lower extremities show right and left hip were nontender to palpation.  Right hip has full extension and flexion to 90 degrees.  She had about 10 degrees internal and external rotation limited by discomfort.  The left hip had full extension and flexion back to about 110 degrees and she had about 20 degrees internal and external rotation without any difficulty.  Bilateral knees were symmetric size and shape.  No sign of erythema or ecchymosis.  No palpable effusions.  She had full extension and flexion back to 120 degrees.  No medial or lateral joint line tenderness.  She had no anterior or power drawer. Calves were nontender.  Bilateral ankles were symmetric with dorsiflexion and plantar flexion.  Peripheral vascular showed carotid pulses were 2+ with no bruits.  Radial pulses were 2+, femoral pulses were 2+, dorsalis pedis and posterior tibial pulses were 2+.  No lower extremity edema or venous stasis changes noted.  NEUROLOGIC: The patient was conscious, alert, appropriate, and held an easy conversation with the examiner.  Cranial nerves 2-12 were grossly intact. Deep tendon reflexes of the upper and lower extremities were grossly intact. The patient was grossly intact from head to toe to light touch sensation.  BREAST/RECTAL/GU: Examinations were deferred.  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  2. Crohns disease.  3. History of migraines.  PLAN: The patient will be admitted to [facility] on December 09, 2000 under the care of Dr. Thereasa Distance A. Mortenson and will undergo a revision of her right hip bipolar prosthesis with repair of her protrusio acetabulum. The  patient will undergo all routine laboratories and tests prior to having  the surgical procedure.  The patient has donated three units of autologous blood for this procedure. DD:  12/07/00 TD:  12/07/00 Job: [id number]

## 2011-01-16 NOTE — Consult Note (Signed)
NAME:  [name], [name]                        ACCOUNT NO.:  [id number]   MEDICAL RECORD NO.:  [id number]                   PATIENT TYPE:  INP   LOCATION:  5735                                 FACILITY:  [facility]   PHYSICIAN:  Graylin Shiver, M.D.                DATE OF BIRTH:  1956/03/04   DATE OF CONSULTATION:  04/13/2003  DATE OF DISCHARGE:                                   CONSULTATION   REASON FOR CONSULTATION:  This patient is a 55 year old Caucasian female  admitted to the hospital with a history of four days of epigastric abdominal  pain and fever.  She states that the fever yesterday was up to 103 degrees  Fahrenheit.  She saw Dr. Coral Spikes, her primary doctor, three days ago, and it  was felt that she could possibly have [facility] Spotted Fever.  She was  started on doxycycline.  She was admitted to the hospital today after labs  showed an elevated lipase of 423 and also some elevation of the amylase at  146.  Her liver enzymes were normal.  She was also found to have a positive  urine culture which was obtained on April 11, 2003, that showed E. coli  greater than 100,000 colonies.   PAST MEDICAL HISTORY:   MEDICAL PROBLEMS:  1. History of Crohn's disease.  She has been followed by Dr. Tasia Catchings     in the past for this.  2. Migraine headaches.  3. Spondyloarthropathy.   SURGERIES:  1. States that six inches of her intestines were removed approximately 10     years ago where the small intestine joined to the large.  2. Bilateral hip replacement.  3. Arthroscopic surgery, both knees.   MEDICATIONS PRIOR TO ADMISSION:  Azulfidine 1000 mg twice a day which she  has been on for years, Zyrtec,  Darvocet, Topamax daily, Zomig, Fiorinal, Doxycycline started this week.   ALLERGIES:  No known drug allergies.   SOCIAL HISTORY:  She denies smoking, or drinking alcohol.   SYSTEMS REVIEW:  She denies anginal chest pains, shortness of breath, cough,  or sputum  production.  She denies hemoptysis.  Although she was found to  have a UTI, she denied dysuria.  She has been experiencing fever.  She has  been experiencing epigastric pain.  She denied vomiting, hematemesis,  melena, hematochezia, or change in bowel habits.   Of note is that she states her Crohn's disease has been under good control,  and this has not been bothering her.   PHYSICAL:  VITAL SIGNS:  Pulse 105, blood pressure 137/75.  GENERAL:  She does not appear in any acute distress at this time.  SKIN:  Nonicteric.  MOUTH:  Mildly dry mucous membranes.  NECK:  Supple.  No masses, adenopathy, or goiter.  HEART:  Regular rhythm.  No murmurs, gallops, or rubs.  LUNGS:  Clear.  ABDOMEN:  Bowel sounds  are normal.  It is soft.  There is some tenderness in  the epigastrium, but no rebound, guarding, or hepatosplenomegaly.  There is  a midline surgical scar from prior surgery.  EXTREMITIES:  Without edema.   IMPRESSION:  1. Acute pancreatitis, etiology unclear.  The patient does not admit to     drinking alcohol.  She did have an abdominal ultrasound done today at an     outside facility, and there was no mention to her of gallstones.  She was     just told her pancreas was swollen.  Looking on E-chart, I do not see the     report.  The etiology of her pancreatitis is unclear at this time.  2. Urinary tract infection with E. coli.  3. History of Crohn's disease which is stable.  4. History of migraine headaches.  5. History of spondyloarthropathy.   PLAN:  The patient is admitted to the hospital for supportive care with  intravenous hydration, analgesics, and she will be kept NPO for now.  Her  labs will be rechecked.  She is on IV antibiotics for the urinary tract  infection.                                               Graylin Shiver, M.D.    Germain Osgood  D:  04/13/2003  T:  04/14/2003  Job:  [id number]   cc:   Georgann Housekeeper, M.D.  [address]  [place]  [place]  [address]  Fax: [phone]   Demetria Pore. Coral Spikes, M.D.  [address]  [place]  [place] [address]  Fax: [phone]   Tasia Catchings, M.D.  [address]  [place]  [place] [address]  Fax: [phone]

## 2011-04-06 ENCOUNTER — Other Ambulatory Visit: Payer: Self-pay | Admitting: Internal Medicine

## 2011-04-06 ENCOUNTER — Other Ambulatory Visit (HOSPITAL_COMMUNITY)
Admission: RE | Admit: 2011-04-06 | Discharge: 2011-04-06 | Disposition: A | Discharge status: Home / Self Care | Payer: BC Managed Care – PPO | Source: Ambulatory Visit | Attending: Internal Medicine | Admitting: Internal Medicine

## 2011-04-06 DIAGNOSIS — Z1159 Encounter for screening for other viral diseases: Secondary | ICD-10-CM | POA: Insufficient documentation

## 2011-04-06 DIAGNOSIS — Z01419 Encounter for gynecological examination (general) (routine) without abnormal findings: Secondary | ICD-10-CM | POA: Insufficient documentation

## 2011-06-02 LAB — COMPREHENSIVE METABOLIC PANEL
ALT: 16
AST: 19
CO2: 21
Calcium: 8.8
Chloride: 98
GFR calc Af Amer: >60
GFR calc non Af Amer: >60
Sodium: 130 — ABNORMAL LOW

## 2011-06-02 LAB — CBC
Hemoglobin: 13.7
RBC: 4.1
RDW: 12.3

## 2012-08-10 ENCOUNTER — Other Ambulatory Visit: Payer: Self-pay | Admitting: Internal Medicine

## 2012-08-10 DIAGNOSIS — Z1231 Encounter for screening mammogram for malignant neoplasm of breast: Secondary | ICD-10-CM

## 2012-08-16 ENCOUNTER — Ambulatory Visit: Payer: BC Managed Care – PPO

## 2012-12-21 ENCOUNTER — Other Ambulatory Visit: Payer: Self-pay | Admitting: Gastroenterology

## 2013-04-18 ENCOUNTER — Ambulatory Visit (HOSPITAL_COMMUNITY): Admit: 2013-04-18 | Payer: Self-pay | Admitting: Gastroenterology

## 2013-04-18 ENCOUNTER — Encounter (HOSPITAL_COMMUNITY): Payer: Self-pay

## 2013-04-18 SURGERY — COLONOSCOPY WITH PROPOFOL
Anesthesia: Monitor Anesthesia Care

## 2013-04-27 DIAGNOSIS — M169 Osteoarthritis of hip, unspecified: Secondary | ICD-10-CM | POA: Insufficient documentation

## 2013-11-01 DIAGNOSIS — Z96649 Presence of unspecified artificial hip joint: Secondary | ICD-10-CM | POA: Insufficient documentation

## 2015-02-05 ENCOUNTER — Encounter (HOSPITAL_COMMUNITY): Payer: Self-pay | Admitting: Emergency Medicine

## 2015-02-05 ENCOUNTER — Emergency Department (HOSPITAL_COMMUNITY)
Admission: EM | Admit: 2015-02-05 | Discharge: 2015-02-06 | Disposition: A | Discharge status: Home / Self Care | Payer: BLUE CROSS/BLUE SHIELD | Attending: Emergency Medicine | Admitting: Emergency Medicine

## 2015-02-05 DIAGNOSIS — G43909 Migraine, unspecified, not intractable, without status migrainosus: Secondary | ICD-10-CM | POA: Insufficient documentation

## 2015-02-05 DIAGNOSIS — Z79899 Other long term (current) drug therapy: Secondary | ICD-10-CM | POA: Insufficient documentation

## 2015-02-05 DIAGNOSIS — K50918 Crohn's disease, unspecified, with other complication: Secondary | ICD-10-CM | POA: Insufficient documentation

## 2015-02-05 DIAGNOSIS — Z792 Long term (current) use of antibiotics: Secondary | ICD-10-CM | POA: Diagnosis not present

## 2015-02-05 DIAGNOSIS — R109 Unspecified abdominal pain: Secondary | ICD-10-CM | POA: Diagnosis present

## 2015-02-05 DIAGNOSIS — Z791 Long term (current) use of non-steroidal anti-inflammatories (NSAID): Secondary | ICD-10-CM | POA: Diagnosis not present

## 2015-02-05 DIAGNOSIS — M1990 Unspecified osteoarthritis, unspecified site: Secondary | ICD-10-CM | POA: Insufficient documentation

## 2015-02-05 HISTORY — DX: Unspecified osteoarthritis, unspecified site: M19.90

## 2015-02-05 HISTORY — DX: Acute pancreatitis without necrosis or infection, unspecified: K85.90

## 2015-02-05 HISTORY — DX: Migraine, unspecified, not intractable, without status migrainosus: G43.909

## 2015-02-05 HISTORY — DX: Foot drop, unspecified foot: M21.379

## 2015-02-05 HISTORY — DX: Crohn's disease, unspecified, without complications: K50.90

## 2015-02-05 NOTE — ED Notes (Signed)
Pt states she has had severe abd pain since 10 am this morning  Pt has hx of chron's disease and has had 6 inches of her small intestine removed  Pt states her stomach is bloated on the right side and she has nausea without vomiting  Denies diarrhea

## 2015-02-06 ENCOUNTER — Encounter (HOSPITAL_COMMUNITY): Payer: Self-pay

## 2015-02-06 ENCOUNTER — Emergency Department (HOSPITAL_COMMUNITY): Payer: BLUE CROSS/BLUE SHIELD

## 2015-02-06 LAB — CBC WITH DIFFERENTIAL/PLATELET
BASOS ABS: 0 10*3/uL (ref 0.0–0.1)
BASOS PCT: 0 % (ref 0–1)
Eosinophils Absolute: 0.1 10*3/uL (ref 0.0–0.7)
Eosinophils Relative: 1 % (ref 0–5)
HCT: 41.1 % (ref 36.0–46.0)
Hemoglobin: 13.7 g/dL (ref 12.0–15.0)
LYMPHS PCT: 17 % (ref 12–46)
Lymphs Abs: 1.4 10*3/uL (ref 0.7–4.0)
MCH: 30.9 pg (ref 26.0–34.0)
MCHC: 33.3 g/dL (ref 30.0–36.0)
MCV: 92.8 fL (ref 78.0–100.0)
MONO ABS: 0.6 10*3/uL (ref 0.1–1.0)
MONOS PCT: 7 % (ref 3–12)
NEUTROS ABS: 5.9 10*3/uL (ref 1.7–7.7)
Neutrophils Relative %: 75 % (ref 43–77)
Platelets: 229 10*3/uL (ref 150–400)
RBC: 4.43 MIL/uL (ref 3.87–5.11)
RDW: 12.9 % (ref 11.5–15.5)
WBC: 7.9 10*3/uL (ref 4.0–10.5)

## 2015-02-06 LAB — COMPREHENSIVE METABOLIC PANEL
ALT: 16 U/L (ref 14–54)
ANION GAP: 11 (ref 5–15)
AST: 20 U/L (ref 15–41)
Albumin: 4.5 g/dL (ref 3.5–5.0)
Alkaline Phosphatase: 65 U/L (ref 38–126)
BILIRUBIN TOTAL: 0.6 mg/dL (ref 0.3–1.2)
BUN: 18 mg/dL (ref 6–20)
CHLORIDE: 112 mmol/L — AB (ref 101–111)
CO2: 21 mmol/L — AB (ref 22–32)
CREATININE: 0.56 mg/dL (ref 0.44–1.00)
Calcium: 10.2 mg/dL (ref 8.9–10.3)
GFR calc non Af Amer: >60 mL/min (ref >60)
Glucose, Bld: 114 mg/dL — ABNORMAL HIGH (ref 65–99)
Potassium: 3.6 mmol/L (ref 3.5–5.1)
SODIUM: 144 mmol/L (ref 135–145)
Total Protein: 7.7 g/dL (ref 6.5–8.1)

## 2015-02-06 LAB — LIPASE, BLOOD: LIPASE: 21 U/L — AB (ref 22–51)

## 2015-02-06 MED ORDER — CIPROFLOXACIN HCL 500 MG PO TABS: 500.0000 mg | ORAL_TABLET | Freq: Two times a day (BID) | ORAL | Status: DC

## 2015-02-06 MED ORDER — METRONIDAZOLE 500 MG PO TABS: 500.0000 mg | ORAL_TABLET | Freq: Three times a day (TID) | ORAL | Status: DC

## 2015-02-06 MED ORDER — ONDANSETRON HCL 4 MG/2ML IJ SOLN
4.0000 mg | Freq: Once | INTRAMUSCULAR | Status: AC
  Administered 2015-02-06: 4 mg via INTRAVENOUS
  Filled 2015-02-06: qty 2

## 2015-02-06 MED ORDER — SODIUM CHLORIDE 0.9 % IV BOLUS (SEPSIS)
1000.0000 mL | Freq: Once | INTRAVENOUS | Status: AC
  Administered 2015-02-06: 1000 mL via INTRAVENOUS

## 2015-02-06 MED ORDER — MORPHINE SULFATE 4 MG/ML IJ SOLN
4.0000 mg | Freq: Once | INTRAMUSCULAR | Status: AC
  Administered 2015-02-06: 4 mg via INTRAVENOUS
  Filled 2015-02-06: qty 1

## 2015-02-06 MED ORDER — CIPROFLOXACIN HCL 500 MG PO TABS
500.0000 mg | ORAL_TABLET | Freq: Once | ORAL | Status: AC
  Administered 2015-02-06: 500 mg via ORAL
  Filled 2015-02-06: qty 1

## 2015-02-06 MED ORDER — PREDNISONE 20 MG PO TABS: 60.0000 mg | ORAL_TABLET | Freq: Every day | ORAL | Status: DC

## 2015-02-06 MED ORDER — METRONIDAZOLE 500 MG PO TABS
500.0000 mg | ORAL_TABLET | Freq: Once | ORAL | Status: AC
  Administered 2015-02-06: 500 mg via ORAL
  Filled 2015-02-06: qty 1

## 2015-02-06 MED ORDER — KETOROLAC TROMETHAMINE 30 MG/ML IJ SOLN
30.0000 mg | Freq: Once | INTRAMUSCULAR | Status: AC
  Administered 2015-02-06: 30 mg via INTRAVENOUS
  Filled 2015-02-06: qty 1

## 2015-02-06 MED ORDER — ONDANSETRON 4 MG PO TBDP: 4.0000 mg | ORAL_TABLET | Freq: Three times a day (TID) | ORAL | Status: DC | PRN

## 2015-02-06 MED ORDER — IOHEXOL 300 MG/ML  SOLN
25.0000 mL | Freq: Once | INTRAMUSCULAR | Status: AC | PRN
Start: 2015-02-06 — End: 2015-02-06
  Administered 2015-02-06: 25 mL via ORAL

## 2015-02-06 MED ORDER — IOHEXOL 300 MG/ML  SOLN
100.0000 mL | Freq: Once | INTRAMUSCULAR | Status: AC | PRN
  Administered 2015-02-06: 100 mL via INTRAVENOUS

## 2015-02-06 MED ORDER — PREDNISONE 20 MG PO TABS
60.0000 mg | ORAL_TABLET | Freq: Once | ORAL | Status: AC
  Administered 2015-02-06: 60 mg via ORAL
  Filled 2015-02-06: qty 3

## 2015-02-06 MED ORDER — SODIUM CHLORIDE 0.9 % IV BOLUS (SEPSIS): 1000.0000 mL | Freq: Once | INTRAVENOUS | Status: DC

## 2015-02-06 NOTE — Discharge Instructions (Signed)
Crohn Disease [name], Your Crohn's Disease has flared up on CT scan.  Take steroids to decrease the inflammation.  Take antibiotics for possible infection seen.  See your primary doctor within 3 days for close follow up.  If symptoms worsen, come back to the ED immediately. Thank you. Crohn disease is a long-term (chronic) soreness and redness (inflammation) of the intestines (bowel). It can affect any portion of the digestive tract, from the mouth to the anus. It can also cause problems outside the digestive tract. Crohn disease is closely related to a disease called ulcerative colitis (together, these two diseases are called inflammatory bowel disease).  CAUSES  The cause of Crohn disease is not known. One Link Snuffer is that, in an easily affected person, the immune system is triggered to attack the body's own digestive tissue. Crohn disease runs in families. It seems to be more common in certain geographic areas and amongst certain races. There are no clear-cut dietary causes.  SYMPTOMS  Crohn disease can cause many different symptoms since it can affect many different parts of the body. Symptoms include:  Fatigue.  Weight loss.  Chronic diarrhea, sometime bloody.  Abdominal pain and cramps.  Fever.  Ulcers or canker sores in the mouth or rectum.  Anemia (low red blood cells).  Arthritis, skin problems, and eye problems may occur. Complications of Crohn disease can include:  Series of holes (perforation) of the bowel.  Portions of the intestines sticking to each other (adhesions).  Obstruction of the bowel.  Fistula formation, typically in the rectal area but also in other areas. A fistula is an opening between the bowels and the outside, or between the bowels and another organ.  A painful crack in the mucous membrane of the anus (rectal fissure). DIAGNOSIS  Your caregiver may suspect Crohn disease based on your symptoms and an exam. Blood tests may confirm that there is a  problem. You may be asked to submit a stool specimen for examination. X-rays and CT scans may be necessary. Ultimately, the diagnosis is usually made after a procedure that uses a flexible tube that is inserted via your mouth or your anus. This is done under sedation and is called either an upper endoscopy or colonoscopy. With these tests, the specialist can take tiny tissue samples and remove them from the inside of the bowel (biopsy). Examination of this biopsy tissue under a microscope can reveal Crohn disease as the cause of your symptoms. Due to the many different forms that Crohn disease can take, symptoms may be present for several years before a diagnosis is made. TREATMENT  Medications are often used to decrease inflammation and control the immune system. These include medicines related to aspirin, steroid medications, and newer and stronger medications to slow down the immune system. Some medications may be used as suppositories or enemas. A number of other medications are used or have been studied. Your caregiver will make specific recommendations. HOME CARE INSTRUCTIONS   Symptoms such as diarrhea can be controlled with medications. Avoid foods that have a laxative effect such as fresh fruit, vegetables, and dairy products. During flare-ups, you can rest your bowel by refraining from solid foods. Drink clear liquids frequently during the day. (Electrolyte or rehydrating fluids are best. Your caregiver can help you with suggestions.) Drink often to prevent loss of body fluids (dehydration). When diarrhea has cleared, eat small meals and more frequently. Avoid food additives and stimulants such as caffeine (coffee, tea, or chocolate). Enzyme supplements may help if  you develop intolerance to a sugar in dairy products (lactose). Ask your caregiver or dietitian about specific dietary instructions.  Try to maintain a positive attitude. Learn relaxation techniques such as self-hypnosis, mental imaging,  and muscle relaxation.  If possible, avoid stresses which can aggravate your condition.  Exercise regularly.  Follow your diet.  Always get plenty of rest. SEEK MEDICAL CARE IF:   Your symptoms fail to improve after a week or two of new treatment.  You experience continued weight loss.  You have ongoing cramps or loose bowels.  You develop a new skin rash, skin sores, or eye problems. SEEK IMMEDIATE MEDICAL CARE IF:   You have worsening of your symptoms or develop new symptoms.  You have a fever.  You develop bloody diarrhea.  You develop severe abdominal pain. MAKE SURE YOU:   Understand these instructions.  Will watch your condition.  Will get help right away if you are not doing well or get worse. Document Released: 05/27/2005 Document Revised: 01/01/2014 Document Reviewed: 04/25/2007 [facility] Patient Information 2015 [place], [place]. This information is not intended to replace advice given to you by your health care provider. Make sure you discuss any questions you have with your health care provider.

## 2015-02-06 NOTE — ED Provider Notes (Signed)
CSN: [id number]     Arrival date & time 02/05/15  2214 History   First MD Initiated Contact with Patient 02/06/15 [phone]     Chief Complaint  Patient presents with  . Abdominal Pain     (Consider location/radiation/quality/duration/timing/severity/associated sxs/prior Treatment) HPI   [name] is a 59 year old female with a past medical history Crohn's disease, pancreatitis, small bowel obstruction presents today with abdominal pain. She states her abdominal pain began yesterday morning at 10 AM and lasted throughout the day. The pain is gradually increased. It is described as sharp and radiating down throughout her abdomen. She's had nausea with no vomiting. She had 1 normal bowel movement today. She denies any fevers or recent changes in her medications. Patient has had no recent infections. She states her urination has been normal. She states this feels similar to her prior Crohn's disease and small bowel obstruction. She no longer takes medications for Crohn's disease. Patient has no further complaints.  10 Systems reviewed and are negative for acute change except as noted in the HPI.    Past Medical History  Diagnosis Date  . Crohn's disease   . Pancreatitis   . Migraine   . Arthritis   . Foot drop    Past Surgical History  Procedure Laterality Date  . Hip replacement x 5     . Knee surgery    . Shoulder surgery    . Bowel resection     Family History  Problem Relation Age of Onset  . Stroke Mother   . Diabetes Mother   . COPD Father   . Arthritis Father    History  Substance Use Topics  . Smoking status: Never Smoker   . Smokeless tobacco: Not on file  . Alcohol Use: Yes     Comment: social    OB History    No data available     Review of Systems    Allergies  Review of patient's allergies indicates no known allergies.  Home Medications   Prior to Admission medications   Medication Sig Start Date End Date Taking? Authorizing Provider  lactobacillus  acidophilus (BACID) TABS tablet Take 1 tablet by mouth daily.   Yes Historical Provider, MD  Multiple Vitamin (MULTI-VITAMINS) TABS Take 1 tablet by mouth daily.   Yes Historical Provider, MD  naproxen sodium (ANAPROX) 220 MG tablet Take 440 mg by mouth 2 (two) times daily as needed (pain).   Yes Historical Provider, MD  oxyCODONE-acetaminophen (PERCOCET/ROXICET) 5-325 MG per tablet Take 1 tablet by mouth every 4 (four) hours as needed. Arthritis pain 09/09/13  Yes Historical Provider, MD  SUMAtriptan (IMITREX) 6 MG/0.5ML SOLN injection Inject 6 mLs into the skin as needed. migraines   Yes Historical Provider, MD  promethazine (PHENERGAN) 25 MG tablet Take 1 tablet by mouth every 4 (four) hours as needed. Nausea/migraines    Historical Provider, MD  topiramate (TOPAMAX) 25 MG tablet Take 1 tablet by mouth at bedtime.    Historical Provider, MD   BP 132/72 mmHg  Pulse 70  Temp(Src) 97.5 F (36.4 C) (Oral)  Resp 20  SpO2 97% Physical Exam  Constitutional: She is oriented to person, place, and time. She appears well-developed and well-nourished. No distress.  HENT:  Head: Normocephalic and atraumatic.  Nose: Nose normal.  Mouth/Throat: Oropharynx is clear and moist. No oropharyngeal exudate.  Eyes: Conjunctivae and EOM are normal. Pupils are equal, round, and reactive to light. No scleral icterus.  Neck: Normal range of motion. Neck  supple. No JVD present. No tracheal deviation present. No thyromegaly present.  Cardiovascular: Normal rate, regular rhythm and normal heart sounds.  Exam reveals no gallop and no friction rub.   No murmur heard. Pulmonary/Chest: Effort normal and breath sounds normal. No respiratory distress. She has no wheezes. She exhibits no tenderness.  Abdominal: Soft. Bowel sounds are normal. She exhibits no distension and no mass. There is tenderness. There is no rebound and no guarding.  Right lower quadrant tenderness to palpation.  Musculoskeletal: Normal range of  motion. She exhibits no edema or tenderness.  Lymphadenopathy:    She has no cervical adenopathy.  Neurological: She is alert and oriented to person, place, and time. No cranial nerve deficit. She exhibits normal muscle tone.  Skin: Skin is warm and dry. No rash noted. No erythema. No pallor.  Nursing note and vitals reviewed.   ED Course  Procedures (including critical care time) Labs Review Labs Reviewed  COMPREHENSIVE METABOLIC PANEL - Abnormal; Notable for the following:    Chloride 112 (*)    CO2 21 (*)    Glucose, Bld 114 (*)    All other components within normal limits  LIPASE, BLOOD - Abnormal; Notable for the following:    Lipase 21 (*)    All other components within normal limits  CBC WITH DIFFERENTIAL/PLATELET    Imaging Review Ct Abdomen Pelvis W Contrast  02/06/2015   CLINICAL DATA:  Abdominal pain and cramping, onset at 10 a.m.  EXAM: CT ABDOMEN AND PELVIS WITH CONTRAST  TECHNIQUE: Multidetector CT imaging of the abdomen and pelvis was performed using the standard protocol following bolus administration of intravenous contrast.  CONTRAST:  140mL OMNIPAQUE IOHEXOL 300 MG/ML  SOLN  COMPARISON:  11/17/2006  FINDINGS: There is prior bowel resection with ileocolic anastomosis in the right abdomen. There is mild mural enhancement of the distal ileum with adjacent slight inflammation/fluid in the mesentery. There is no obstruction. There is no abscess. There is no extraluminal air. The remainder of the bowel is normal in appearance. There is a small volume ascites.  There are normal appearances of the liver, gallbladder, pancreas, spleen, adrenals and kidneys.  The abdominal aorta is normal in caliber with no atherosclerotic calcification.  There is no significant abnormality in the lower chest.  There is mild metal artifact from bilateral hip arthroplasty hardware. No significant musculoskeletal lesions are evident.  There is no significant abnormality in the lower chest.  IMPRESSION:  Mild mural thickening of the distal ileum. Slight adjacent mesenteric edema. This could represent mild recurrent inflammation associated with Crohn's disease. No abscess. No obstruction.   Electronically Signed   By: Andreas Newport M.D.   On: 02/06/2015 04:21     EKG Interpretation None      MDM   Final diagnoses:  Abdominal pain    Patient presents to the emergency department for severe abdominal pain that has gradually worsened. Differential diagnosis includes small bowel obstruction, appendicitis, Crohn's disease and abscess, diverticulitis. Patient is given morphine and Zofran for symptomatic control. We'll obtain CT scan of abdomen and pelvis with contrast for further evaluation. Laboratory studies are currently unremarkable.  Patient was redosed with Toradol for pain control. CT scan reveals no thickening of the distal ileum. This is consistent with physical exam. We will treat with prednisone burst and antibiotics. She has a primary care physician, she is encouraged follow-up within the next 3 days. Acetaminophen normal limits and she is safe for discharge. First dose of prednisone, ciprofloxacin, Flagyl  given in the emergency department.  Everlene Balls, MD 02/06/15 [phone]

## 2015-02-06 NOTE — ED Notes (Signed)
Patient states she was sitting at work, when she developed abd pain. Pain described as burning and cramps throughout abd. Bloating reported and distention noted. Reports nausea with no vomiting. Denies fever, diarrhea, or constipation.

## 2015-03-10 ENCOUNTER — Emergency Department (HOSPITAL_COMMUNITY)
Admission: EM | Admit: 2015-03-10 | Discharge: 2015-03-10 | Disposition: A | Discharge status: Home / Self Care | Payer: BLUE CROSS/BLUE SHIELD | Attending: Emergency Medicine | Admitting: Emergency Medicine

## 2015-03-10 ENCOUNTER — Emergency Department (HOSPITAL_COMMUNITY): Payer: BLUE CROSS/BLUE SHIELD

## 2015-03-10 ENCOUNTER — Encounter (HOSPITAL_COMMUNITY): Payer: Self-pay | Admitting: Emergency Medicine

## 2015-03-10 DIAGNOSIS — K5010 Crohn's disease of large intestine without complications: Secondary | ICD-10-CM | POA: Diagnosis not present

## 2015-03-10 DIAGNOSIS — M1990 Unspecified osteoarthritis, unspecified site: Secondary | ICD-10-CM | POA: Diagnosis not present

## 2015-03-10 DIAGNOSIS — G43909 Migraine, unspecified, not intractable, without status migrainosus: Secondary | ICD-10-CM | POA: Diagnosis not present

## 2015-03-10 DIAGNOSIS — R1031 Right lower quadrant pain: Secondary | ICD-10-CM | POA: Diagnosis present

## 2015-03-10 DIAGNOSIS — Z79899 Other long term (current) drug therapy: Secondary | ICD-10-CM | POA: Insufficient documentation

## 2015-03-10 LAB — CBC WITH DIFFERENTIAL/PLATELET
Basophils Absolute: 0 10*3/uL (ref 0.0–0.1)
Basophils Relative: 0 % (ref 0–1)
EOS PCT: 1 % (ref 0–5)
Eosinophils Absolute: 0.1 10*3/uL (ref 0.0–0.7)
HCT: 40.2 % (ref 36.0–46.0)
Hemoglobin: 13.4 g/dL (ref 12.0–15.0)
LYMPHS ABS: 1.4 10*3/uL (ref 0.7–4.0)
Lymphocytes Relative: 16 % (ref 12–46)
MCH: 31.2 pg (ref 26.0–34.0)
MCHC: 33.3 g/dL (ref 30.0–36.0)
MCV: 93.7 fL (ref 78.0–100.0)
MONO ABS: 0.5 10*3/uL (ref 0.1–1.0)
Monocytes Relative: 6 % (ref 3–12)
NEUTROS ABS: 6.8 10*3/uL (ref 1.7–7.7)
Neutrophils Relative %: 77 % (ref 43–77)
Platelets: 228 10*3/uL (ref 150–400)
RBC: 4.29 MIL/uL (ref 3.87–5.11)
RDW: 13.3 % (ref 11.5–15.5)
WBC: 8.8 10*3/uL (ref 4.0–10.5)

## 2015-03-10 LAB — URINALYSIS, ROUTINE W REFLEX MICROSCOPIC
Bilirubin Urine: NEGATIVE
GLUCOSE, UA: NEGATIVE mg/dL
Hgb urine dipstick: NEGATIVE
KETONES UR: NEGATIVE mg/dL
Leukocytes, UA: NEGATIVE
NITRITE: NEGATIVE
PH: 7.5 (ref 5.0–8.0)
PROTEIN: NEGATIVE mg/dL
Specific Gravity, Urine: 1.01 (ref 1.005–1.030)
Urobilinogen, UA: 0.2 mg/dL (ref 0.0–1.0)

## 2015-03-10 LAB — COMPREHENSIVE METABOLIC PANEL
ALT: 15 U/L (ref 14–54)
AST: 21 U/L (ref 15–41)
Albumin: 3.7 g/dL (ref 3.5–5.0)
Alkaline Phosphatase: 58 U/L (ref 38–126)
Anion gap: 10 (ref 5–15)
BILIRUBIN TOTAL: 0.4 mg/dL (ref 0.3–1.2)
BUN: 13 mg/dL (ref 6–20)
CHLORIDE: 112 mmol/L — AB (ref 101–111)
CO2: 19 mmol/L — ABNORMAL LOW (ref 22–32)
Calcium: 9.5 mg/dL (ref 8.9–10.3)
Creatinine, Ser: 0.48 mg/dL (ref 0.44–1.00)
GFR calc non Af Amer: >60 mL/min (ref >60)
Glucose, Bld: 93 mg/dL (ref 65–99)
Potassium: 3.7 mmol/L (ref 3.5–5.1)
SODIUM: 141 mmol/L (ref 135–145)
Total Protein: 6.6 g/dL (ref 6.5–8.1)

## 2015-03-10 LAB — LIPASE, BLOOD: Lipase: 21 U/L — ABNORMAL LOW (ref 22–51)

## 2015-03-10 MED ORDER — DICYCLOMINE HCL 10 MG PO CAPS: 10.0000 mg | ORAL_CAPSULE | Freq: Once | ORAL | Status: DC

## 2015-03-10 MED ORDER — DEXAMETHASONE SODIUM PHOSPHATE 10 MG/ML IJ SOLN
10.0000 mg | Freq: Once | INTRAMUSCULAR | Status: AC
  Administered 2015-03-10: 10 mg via INTRAVENOUS
  Filled 2015-03-10: qty 1

## 2015-03-10 MED ORDER — HYDROMORPHONE HCL 1 MG/ML IJ SOLN
1.0000 mg | Freq: Once | INTRAMUSCULAR | Status: AC
  Administered 2015-03-10: 1 mg via INTRAVENOUS
  Filled 2015-03-10: qty 1

## 2015-03-10 MED ORDER — MORPHINE SULFATE 4 MG/ML IJ SOLN
4.0000 mg | Freq: Once | INTRAMUSCULAR | Status: AC
  Administered 2015-03-10: 4 mg via INTRAVENOUS
  Filled 2015-03-10: qty 1

## 2015-03-10 MED ORDER — OXYCODONE-ACETAMINOPHEN 5-325 MG PO TABS: 1.0000 | ORAL_TABLET | Freq: Four times a day (QID) | ORAL | Status: DC | PRN

## 2015-03-10 MED ORDER — ONDANSETRON 4 MG PO TBDP: ORAL_TABLET | ORAL | Status: DC

## 2015-03-10 MED ORDER — KETOROLAC TROMETHAMINE 30 MG/ML IJ SOLN
30.0000 mg | Freq: Once | INTRAMUSCULAR | Status: AC
  Administered 2015-03-10: 30 mg via INTRAVENOUS
  Filled 2015-03-10: qty 1

## 2015-03-10 MED ORDER — SODIUM CHLORIDE 0.9 % IV BOLUS (SEPSIS)
500.0000 mL | Freq: Once | INTRAVENOUS | Status: AC
  Administered 2015-03-10: 500 mL via INTRAVENOUS

## 2015-03-10 MED ORDER — IOHEXOL 300 MG/ML  SOLN
100.0000 mL | Freq: Once | INTRAMUSCULAR | Status: AC | PRN
  Administered 2015-03-10: 80 mL via INTRAVENOUS

## 2015-03-10 NOTE — ED Notes (Signed)
Pt states no relief from abdominal pain. 8/10 pain at the time. No active vomiting.

## 2015-03-10 NOTE — ED Notes (Addendum)
Pt here from home with a history of Chron's. Reports bilateral lower quadrant abdominal pain since 0400 this morning. Pt has tenderness to right upper quadrant upon palpation. Pt has not taken anything. Last flare of Chron's/diverticulitis was a month ago where she was seen at [facility]. Denies diarrhea but does reports some nausea that comes and goes with pain.

## 2015-03-10 NOTE — ED Provider Notes (Signed)
CSN: [id number]     Arrival date & time 03/10/15  0740 History   First MD Initiated Contact with Patient 03/10/15 [phone]     Chief Complaint  Patient presents with  . Abdominal Pain     (Consider location/radiation/quality/duration/timing/severity/associated sxs/prior Treatment) HPI Comments: 59 year old female with a past medical history of Crohn's, pancreatitis, arthritis and migraines presenting with sudden onset right lower quadrant abdominal pain beginning at 4 AM today.  States she feels bloated, pain 10/10, nonradiating.  No aggravating or alleviating factors.  States her Crohn's has been very well-controlled up until one month ago when she had a flare, was put on antibiotic-impregnated.  Her Crohn's has been so controlled that she does not see GI.  Admits to nausea with pain only.  No vomiting or diarrhea.  No melena or hematochezia.  Last bowel movement was last night and normal.  No fevers.  Had a CT scan 1 month ago at the ED visit confirming a Crohn's flare.  Patient is a 59 y.o. female presenting with abdominal pain. The history is provided by the patient.  Abdominal Pain Pain location:  RLQ Pain quality: aching and bloating   Pain radiates to:  Does not radiate Pain severity:  Severe Onset quality:  Sudden Duration:  4 hours Timing:  Constant Progression:  Unchanged Chronicity:  Recurrent Relieved by:  None tried Worsened by:  Nothing tried Ineffective treatments:  None tried Associated symptoms: nausea     Past Medical History  Diagnosis Date  . Crohn's disease   . Pancreatitis   . Migraine   . Arthritis   . Foot drop    Past Surgical History  Procedure Laterality Date  . Hip replacement x 5     . Knee surgery    . Shoulder surgery    . Bowel resection     Family History  Problem Relation Age of Onset  . Stroke Mother   . Diabetes Mother   . COPD Father   . Arthritis Father    History  Substance Use Topics  . Smoking status: Never Smoker   .  Smokeless tobacco: Not on file  . Alcohol Use: Yes     Comment: social    OB History    No data available     Review of Systems  Gastrointestinal: Positive for nausea and abdominal pain.  All other systems reviewed and are negative.     Allergies  Review of patient's allergies indicates no known allergies.  Home Medications   Prior to Admission medications   Medication Sig Start Date End Date Taking? Authorizing Provider  Multiple Vitamin (MULTI-VITAMINS) TABS Take 1 tablet by mouth daily.   Yes Historical Provider, MD  naproxen sodium (ANAPROX) 220 MG tablet Take 440 mg by mouth 2 (two) times daily as needed (pain).   Yes Historical Provider, MD  promethazine (PHENERGAN) 25 MG tablet Take 1 tablet by mouth every 4 (four) hours as needed. Nausea/migraines   Yes Historical Provider, MD  SUMAtriptan (IMITREX) 6 MG/0.5ML SOLN injection Inject 6 mLs into the skin as needed. migraines   Yes Historical Provider, MD  topiramate (TOPAMAX) 25 MG tablet Take 1 tablet by mouth at bedtime.   Yes Historical Provider, MD  ondansetron (ZOFRAN ODT) 4 MG disintegrating tablet 4mg  ODT q4 hours prn nausea/vomit 03/10/15   Gionna Polak M Jolly Bleicher, PA-C  oxyCODONE-acetaminophen (PERCOCET) 5-325 MG per tablet Take 1-2 tablets by mouth every 6 (six) hours as needed for severe pain. 03/10/15   Hessie Diener  Cicilia Clinger, PA-C   BP 117/65 mmHg  Pulse 70  Temp(Src) 97.7 F (36.5 C)  Resp 18  Ht 5\' 1"  (1.549 m)  Wt 142 lb (64.411 kg)  BMI 26.84 kg/m2  SpO2 99% Physical Exam  Constitutional: She is oriented to person, place, and time. She appears well-developed and well-nourished. No distress.  HENT:  Head: Normocephalic and atraumatic.  Mouth/Throat: Oropharynx is clear and moist.  Eyes: Conjunctivae and EOM are normal.  Neck: Normal range of motion. Neck supple.  Cardiovascular: Normal rate, regular rhythm and normal heart sounds.   Pulmonary/Chest: Effort normal and breath sounds normal. No respiratory distress.   Abdominal: There is tenderness in the right lower quadrant and left upper quadrant. There is guarding. There is no rigidity and no rebound.  Musculoskeletal: Normal range of motion. She exhibits no edema.  Neurological: She is alert and oriented to person, place, and time. No sensory deficit.  Skin: Skin is warm and dry.  Psychiatric: She has a normal mood and affect. Her behavior is normal.  Nursing note and vitals reviewed.   ED Course  Procedures (including critical care time) Labs Review Labs Reviewed  COMPREHENSIVE METABOLIC PANEL - Abnormal; Notable for the following:    Chloride 112 (*)    CO2 19 (*)    All other components within normal limits  LIPASE, BLOOD - Abnormal; Notable for the following:    Lipase 21 (*)    All other components within normal limits  URINALYSIS, ROUTINE W REFLEX MICROSCOPIC (NOT AT [facility]) - Abnormal; Notable for the following:    APPearance CLOUDY (*)    All other components within normal limits  CBC WITH DIFFERENTIAL/PLATELET    Imaging Review Ct Abdomen Pelvis W Contrast  03/10/2015   CLINICAL DATA:  Acute onset of right lower quadrant pain and swelling this morning. Nausea. Crohn's disease. Personal history of pancreatitis.  EXAM: CT ABDOMEN AND PELVIS WITH CONTRAST  TECHNIQUE: Multidetector CT imaging of the abdomen and pelvis was performed using the standard protocol following bolus administration of intravenous contrast.  CONTRAST:  70mL OMNIPAQUE IOHEXOL 300 MG/ML  SOLN  COMPARISON:  02/06/2015  FINDINGS: Lower Chest: No acute findings.  Hepatobiliary: No masses or other significant abnormality identified. Gallbladder is unremarkable.  Pancreas: No mass, inflammatory changes, or other significant abnormality identified.  Spleen:  Within normal limits in size and appearance.  Adrenals:  No masses identified.  Kidneys/Urinary Tract: No evidence of masses or hydronephrosis. Tiny benign-appearing Bosniak category 1 and 2 left renal cysts again noted.   Stomach/Bowel/Peritoneum: Primary ileocolic anastomosis again noted. Mild wall thickening and enhancement is seen involving the distal ileum with mild adjacent mesenteric inflammatory changes, without significant change since previous study. This is consistent with active Crohn's disease. There is no evidence of abscess, fistula, or free air. No evidence of bowel obstruction. Small amount of free fluid again seen within the pelvic cul-de-sac.  Vascular/Lymphatic: No pathologically enlarged lymph nodes identified. No abdominal aortic aneurysm or other significant retroperitoneal abnormality demonstrated.  Reproductive:  No mass or other significant abnormality identified.  Other: Bilateral hip prostheses result in significant beam hardening artifact obscuring visualization of the lower pelvis.  Musculoskeletal:  No suspicious bone lesions identified.  IMPRESSION: Mild active Crohn's disease involving the distal ileum, without significant change compared to previous study. No evidence of abscess, bowel obstruction, or other complication.   Electronically Signed   By: Earle Gell M.D.   On: 03/10/2015 10:59     EKG Interpretation None  MDM   Final diagnoses:  Crohn's colitis, without complications   Patient with history of Crohn's, with sudden onset abdominal pain.  Nontoxic-appearing, NAD.  AF VSS.  Abdomen is soft, tenderness in right lower quadrant and left upper quadrant with guarding. Had a CT scan 1 month ago as stated above.  Into check labs in control pain.  Hold on any imaging at this time.  If pain increases or patient begins vomiting, will reassess for possible imaging.  Patient agreeable to plan. 8:00 AM.  Patient states pain improved from 10/10-6/10.  Labs T finding.  On exam, patient is no longer tender in left upper quadrant, however, tender to very light on the right side of her abdomen.  Negative Murphy sign.  Increased tenderness to right lower quadrant.  Will obtain CT. 9:00  AM.  11:35 AM CT scan showing mild active Crohn's disease.  No abscess, bowel obstruction or any other complication.  No significant change since prior CT.  Patient reports her pain is still improving.  Abdomen is still tender in the right lower quadrant, however, significant improvement.  Will give the patient a dose of IV Decadron and have her follow-up with gastroenterology and her PCP.  I do not feel antibiotics are necessary at this time as she had a course 1 month ago, does not have fever or any leukocytosis. No diarrhea. Discharge home with Zofran and Percocet.  Stable for discharge. Return precautions given. Patient states understanding of treatment care plan and is agreeable.  Carman Ching, PA-C 03/10/15 1137  Debby Freiberg, MD 03/11/15 [phone]

## 2015-03-10 NOTE — Discharge Instructions (Signed)
Take percocet for severe pain only. No driving or operating heavy machinery while taking percocet. This medication may cause drowsiness. Take Zofran as directed as needed for nausea.   Crohn Disease Crohn disease is a long-term (chronic) soreness and redness (inflammation) of the intestines (bowel). It can affect any portion of the digestive tract, from the mouth to the anus. It can also cause problems outside the digestive tract. Crohn disease is closely related to a disease called ulcerative colitis (together, these two diseases are called inflammatory bowel disease).  CAUSES  The cause of Crohn disease is not known. One Link Snuffer is that, in an easily affected person, the immune system is triggered to attack the body's own digestive tissue. Crohn disease runs in families. It seems to be more common in certain geographic areas and amongst certain races. There are no clear-cut dietary causes.  SYMPTOMS  Crohn disease can cause many different symptoms since it can affect many different parts of the body. Symptoms include:  Fatigue.  Weight loss.  Chronic diarrhea, sometime bloody.  Abdominal pain and cramps.  Fever.  Ulcers or canker sores in the mouth or rectum.  Anemia (low red blood cells).  Arthritis, skin problems, and eye problems may occur. Complications of Crohn disease can include:  Series of holes (perforation) of the bowel.  Portions of the intestines sticking to each other (adhesions).  Obstruction of the bowel.  Fistula formation, typically in the rectal area but also in other areas. A fistula is an opening between the bowels and the outside, or between the bowels and another organ.  A painful crack in the mucous membrane of the anus (rectal fissure). DIAGNOSIS  Your caregiver may suspect Crohn disease based on your symptoms and an exam. Blood tests may confirm that there is a problem. You may be asked to submit a stool specimen for examination. X-rays and CT scans may  be necessary. Ultimately, the diagnosis is usually made after a procedure that uses a flexible tube that is inserted via your mouth or your anus. This is done under sedation and is called either an upper endoscopy or colonoscopy. With these tests, the specialist can take tiny tissue samples and remove them from the inside of the bowel (biopsy). Examination of this biopsy tissue under a microscope can reveal Crohn disease as the cause of your symptoms. Due to the many different forms that Crohn disease can take, symptoms may be present for several years before a diagnosis is made. TREATMENT  Medications are often used to decrease inflammation and control the immune system. These include medicines related to aspirin, steroid medications, and newer and stronger medications to slow down the immune system. Some medications may be used as suppositories or enemas. A number of other medications are used or have been studied. Your caregiver will make specific recommendations. HOME CARE INSTRUCTIONS   Symptoms such as diarrhea can be controlled with medications. Avoid foods that have a laxative effect such as fresh fruit, vegetables, and dairy products. During flare-ups, you can rest your bowel by refraining from solid foods. Drink clear liquids frequently during the day. (Electrolyte or rehydrating fluids are best. Your caregiver can help you with suggestions.) Drink often to prevent loss of body fluids (dehydration). When diarrhea has cleared, eat small meals and more frequently. Avoid food additives and stimulants such as caffeine (coffee, tea, or chocolate). Enzyme supplements may help if you develop intolerance to a sugar in dairy products (lactose). Ask your caregiver or dietitian about specific dietary instructions.  Try to maintain a positive attitude. Learn relaxation techniques such as self-hypnosis, mental imaging, and muscle relaxation.  If possible, avoid stresses which can aggravate your  condition.  Exercise regularly.  Follow your diet.  Always get plenty of rest. SEEK MEDICAL CARE IF:   Your symptoms fail to improve after a week or two of new treatment.  You experience continued weight loss.  You have ongoing cramps or loose bowels.  You develop a new skin rash, skin sores, or eye problems. SEEK IMMEDIATE MEDICAL CARE IF:   You have worsening of your symptoms or develop new symptoms.  You have a fever.  You develop bloody diarrhea.  You develop severe abdominal pain. MAKE SURE YOU:   Understand these instructions.  Will watch your condition.  Will get help right away if you are not doing well or get worse. Document Released: 05/27/2005 Document Revised: 01/01/2014 Document Reviewed: 04/25/2007 [facility] Patient Information 2015 [place], [place]. This information is not intended to replace advice given to you by your health care provider. Make sure you discuss any questions you have with your health care provider.

## 2015-03-13 ENCOUNTER — Other Ambulatory Visit: Payer: Self-pay | Admitting: Gastroenterology

## 2015-03-26 ENCOUNTER — Encounter (HOSPITAL_COMMUNITY): Payer: Self-pay | Admitting: *Deleted

## 2015-04-02 ENCOUNTER — Ambulatory Visit (HOSPITAL_COMMUNITY): Payer: BLUE CROSS/BLUE SHIELD | Admitting: Certified Registered Nurse Anesthetist

## 2015-04-02 ENCOUNTER — Encounter (HOSPITAL_COMMUNITY): Payer: Self-pay | Admitting: Anesthesiology

## 2015-04-02 ENCOUNTER — Ambulatory Visit (HOSPITAL_COMMUNITY)
Admission: RE | Admit: 2015-04-02 | Discharge: 2015-04-02 | Disposition: A | Discharge status: Home / Self Care | Payer: BLUE CROSS/BLUE SHIELD | Source: Ambulatory Visit | Attending: Gastroenterology | Admitting: Gastroenterology

## 2015-04-02 ENCOUNTER — Encounter (HOSPITAL_COMMUNITY)
Admission: RE | Disposition: A | Discharge status: Home / Self Care | Payer: Self-pay | Source: Ambulatory Visit | Attending: Gastroenterology

## 2015-04-02 DIAGNOSIS — G43909 Migraine, unspecified, not intractable, without status migrainosus: Secondary | ICD-10-CM | POA: Diagnosis not present

## 2015-04-02 DIAGNOSIS — Z96643 Presence of artificial hip joint, bilateral: Secondary | ICD-10-CM | POA: Diagnosis not present

## 2015-04-02 DIAGNOSIS — Z980 Intestinal bypass and anastomosis status: Secondary | ICD-10-CM | POA: Insufficient documentation

## 2015-04-02 DIAGNOSIS — K5090 Crohn's disease, unspecified, without complications: Secondary | ICD-10-CM | POA: Diagnosis not present

## 2015-04-02 DIAGNOSIS — D472 Monoclonal gammopathy: Secondary | ICD-10-CM | POA: Diagnosis not present

## 2015-04-02 DIAGNOSIS — M1990 Unspecified osteoarthritis, unspecified site: Secondary | ICD-10-CM | POA: Diagnosis not present

## 2015-04-02 HISTORY — DX: Long term (current) use of systemic steroids: Z79.52

## 2015-04-02 HISTORY — PX: COLONOSCOPY WITH PROPOFOL: SHX5780

## 2015-04-02 SURGERY — COLONOSCOPY WITH PROPOFOL
Anesthesia: Monitor Anesthesia Care

## 2015-04-02 MED ORDER — DEXAMETHASONE SODIUM PHOSPHATE 10 MG/ML IJ SOLN
INTRAMUSCULAR | Status: AC
  Filled 2015-04-02: qty 1

## 2015-04-02 MED ORDER — DEXAMETHASONE SODIUM PHOSPHATE 4 MG/ML IJ SOLN
INTRAMUSCULAR | Status: DC | PRN
  Administered 2015-04-02: 10 mg via INTRAVENOUS

## 2015-04-02 MED ORDER — PROPOFOL 10 MG/ML IV BOLUS
INTRAVENOUS | Status: AC
  Filled 2015-04-02: qty 20

## 2015-04-02 MED ORDER — PROPOFOL 10 MG/ML IV BOLUS
INTRAVENOUS | Status: DC | PRN
  Administered 2015-04-02: 10 mg via INTRAVENOUS
  Administered 2015-04-02: 50 mg via INTRAVENOUS
  Administered 2015-04-02: 30 mg via INTRAVENOUS
  Administered 2015-04-02: 20 mg via INTRAVENOUS

## 2015-04-02 MED ORDER — LIDOCAINE HCL (CARDIAC) 20 MG/ML IV SOLN
INTRAVENOUS | Status: DC | PRN
  Administered 2015-04-02 (×2): 50 mg via INTRAVENOUS

## 2015-04-02 MED ORDER — LIDOCAINE HCL (CARDIAC) 20 MG/ML IV SOLN
INTRAVENOUS | Status: AC
  Filled 2015-04-02: qty 5

## 2015-04-02 MED ORDER — LACTATED RINGERS IV SOLN
INTRAVENOUS | Status: DC | PRN
  Administered 2015-04-02: 08:00:00 via INTRAVENOUS

## 2015-04-02 MED ORDER — SODIUM CHLORIDE 0.9 % IV SOLN: INTRAVENOUS | Status: DC

## 2015-04-02 MED ORDER — PROPOFOL INFUSION 10 MG/ML OPTIME
INTRAVENOUS | Status: DC | PRN
  Administered 2015-04-02: 300 ug/kg/min via INTRAVENOUS
  Administered 2015-04-02: 180 ug/kg/min via INTRAVENOUS

## 2015-04-02 SURGICAL SUPPLY — 21 items

## 2015-04-02 NOTE — Anesthesia Preprocedure Evaluation (Addendum)
Anesthesia Evaluation  Patient identified by MRN, date of birth, ID band Patient awake    Reviewed: Allergy & Precautions, NPO status , Patient's Chart, lab work & pertinent test results  Airway Mallampati: II  TM Distance: >3 FB Neck ROM: Full    Dental no notable dental hx.    Pulmonary neg pulmonary ROS,  breath sounds clear to auscultation  Pulmonary exam normal       Cardiovascular Exercise Tolerance: Good negative cardio ROS Normal cardiovascular examRhythm:Regular Rate:Normal     Neuro/Psych  Headaches, negative psych ROS   GI/Hepatic negative GI ROS, Neg liver ROS,   Endo/Other  negative endocrine ROS  Renal/GU negative Renal ROS  negative genitourinary   Musculoskeletal  (+) Arthritis -,   Abdominal   Peds negative pediatric ROS (+)  Hematology negative hematology ROS (+)   Anesthesia Other Findings   Reproductive/Obstetrics negative OB ROS                             Anesthesia Physical Anesthesia Plan  ASA: II  Anesthesia Plan: MAC   Post-op Pain Management:    Induction: Intravenous  Airway Management Planned: Natural Airway  Additional Equipment:   Intra-op Plan:   Post-operative Plan:   Informed Consent: I have reviewed the patients History and Physical, chart, labs and discussed the procedure including the risks, benefits and alternatives for the proposed anesthesia with the patient or authorized representative who has indicated his/her understanding and acceptance.   Dental advisory given  Plan Discussed with: CRNA  Anesthesia Plan Comments:         Anesthesia Quick Evaluation

## 2015-04-02 NOTE — Anesthesia Postprocedure Evaluation (Signed)
  Anesthesia Post-op Note  Patient: [name]  Procedure(s) Performed: Procedure(s) (LRB): COLONOSCOPY WITH PROPOFOL (N/A)  Patient Location: PACU  Anesthesia Type: MAC  Level of Consciousness: awake and alert   Airway and Oxygen Therapy: Patient Spontanous Breathing  Post-op Pain: mild  Post-op Assessment: Post-op Vital signs reviewed, Patient's Cardiovascular Status Stable, Respiratory Function Stable, Patent Airway and No signs of Nausea or vomiting  Last Vitals:  Filed Vitals:   04/02/15 0900  BP: 144/74  Pulse: 76  Temp:   Resp: 13    Post-op Vital Signs: stable   Complications: No apparent anesthesia complications

## 2015-04-02 NOTE — Discharge Instructions (Signed)
Colonoscopy, Care After °Refer to this sheet in the next few weeks. These instructions provide you with information on caring for yourself after your procedure. Your health care provider may also give you more specific instructions. Your treatment has been planned according to current medical practices, but problems sometimes occur. Call your health care provider if you have any problems or questions after your procedure. °WHAT TO EXPECT AFTER THE PROCEDURE  °After your procedure, it is typical to have the following: °· A small amount of blood in your stool. °· Moderate amounts of gas and mild abdominal cramping or bloating. °HOME CARE INSTRUCTIONS °· Do not drive, operate machinery, or sign important documents for 24 hours. °· You may shower and resume your regular physical activities, but move at a slower pace for the first 24 hours. °· Take frequent rest periods for the first 24 hours. °· Walk around or put a warm pack on your abdomen to help reduce abdominal cramping and bloating. °· Drink enough fluids to keep your urine clear or pale yellow. °· You may resume your normal diet as instructed by your health care provider. Avoid heavy or fried foods that are hard to digest. °· Avoid drinking alcohol for 24 hours or as instructed by your health care provider. °· Only take over-the-counter or prescription medicines as directed by your health care provider. °· If a tissue sample (biopsy) was taken during your procedure: °¨ Do not take aspirin or blood thinners for 7 days, or as instructed by your health care provider. °¨ Do not drink alcohol for 7 days, or as instructed by your health care provider. °¨ Eat soft foods for the first 24 hours. °SEEK MEDICAL CARE IF: °You have persistent spotting of blood in your stool 2-3 days after the procedure. °SEEK IMMEDIATE MEDICAL CARE IF: °· You have more than a small spotting of blood in your stool. °· You pass large blood clots in your stool. °· Your abdomen is swollen  (distended). °· You have nausea or vomiting. °· You have a fever. °· You have increasing abdominal pain that is not relieved with medicine. °Document Released: 03/31/2004 Document Revised: 06/07/2013 Document Reviewed: 04/24/2013 °Exit[employer]® Patient Information ©2015 [employer]. This information is not intended to replace advice given to you by your health care provider. Make sure you discuss any questions you have with your health care provider. ° °Conscious Sedation, Adult, Care After °Refer to this sheet in the next few weeks. These instructions provide you with information on caring for yourself after your procedure. Your health care provider may also give you more specific instructions. Your treatment has been planned according to current medical practices, but problems sometimes occur. Call your health care provider if you have any problems or questions after your procedure. °WHAT TO EXPECT AFTER THE PROCEDURE  °After your procedure: °· You may feel sleepy, clumsy, and have poor balance for several hours. °· Vomiting may occur if you eat too soon after the procedure. °HOME CARE INSTRUCTIONS °· Do not participate in any activities where you could become injured for at least 24 hours. Do not: °¨ Drive. °¨ Swim. °¨ Ride a bicycle. °¨ Operate heavy machinery. °¨ Cook. °¨ Use power tools. °¨ Climb ladders. °¨ Work from a high place. °· Do not make important decisions or sign legal documents until you are improved. °· If you vomit, drink water, juice, or soup when you can drink without vomiting. Make sure you have little or no nausea before eating solid foods. °·   Only take over-the-counter or prescription medicines for pain, discomfort, or fever as directed by your health care provider. °· Make sure you and your family fully understand everything about the medicines given to you, including what side effects may occur. °· You should not drink alcohol, take sleeping pills, or take medicines that cause drowsiness for  at least 24 hours. °· If you smoke, do not smoke without supervision. °· If you are feeling better, you may resume normal activities 24 hours after you were sedated. °· Keep all appointments with your health care provider. °SEEK MEDICAL CARE IF: °· Your skin is pale or bluish in color. °· You continue to feel nauseous or vomit. °· Your pain is getting worse and is not helped by medicine. °· You have bleeding or swelling. °· You are still sleepy or feeling clumsy after 24 hours. °SEEK IMMEDIATE MEDICAL CARE IF: °· You develop a rash. °· You have difficulty breathing. °· You develop any type of allergic problem. °· You have a fever. °MAKE SURE YOU: °· Understand these instructions. °· Will watch your condition. °· Will get help right away if you are not doing well or get worse. °Document Released: 06/07/2013 Document Reviewed: 06/07/2013 °ExitCare® Patient Information ©2015 [employer]. This information is not intended to replace advice given to you by your health care provider. Make sure you discuss any questions you have with your health care provider. ° ° °

## 2015-04-02 NOTE — Transfer of Care (Signed)
Immediate Anesthesia Transfer of Care Note  Patient: [name]  Procedure(s) Performed: Procedure(s): COLONOSCOPY WITH PROPOFOL (N/A)  Patient Location: [facility]  Anesthesia Type:MAC  Level of Consciousness: Patient easily awoken, sedated, comfortable, cooperative, following commands, responds to stimulation.   Airway & Oxygen Therapy: Patient spontaneously breathing, ventilating well, oxygen via simple oxygen mask.  Post-op Assessment: Report given to PACU RN, vital signs reviewed and stable, moving all extremities.   Post vital signs: Reviewed and stable.  Complications: No apparent anesthesia complications

## 2015-04-02 NOTE — H&P (Signed)
  Problem: Chronic Crohn's ileocolitis. 02/06/2015 and 03/10/2015 CT scans of the abdomen and pelvis showed thickening of the distal ileum without abscess or obstruction. 2008 normal barium small bowel follow-through x-ray series. 2007 normal esophagogastroduodenoscopy. 1994 terminal ileum-right colon resection to treat intestinal obstruction secondary to Crohn's disease. 1994 incomplete colonoscopy showed colitis to the transverse colon.  History: The patient is a 59 year old female born 08-26-1956.  In 1994, the patient underwent a terminal ileum-right colon resection to treat intestinal obstruction secondary to Crohn's disease.  On 02/06/2015 and 03/10/2015 the patient was evaluated in the emergency room with right lower quadrant abdominal pain. She underwent CT scans of the abdomen and pelvis with contrast during both visits which showed probable active Crohn's ileitis unassociated with abscess or intestinal obstruction. On 03/11/2015 the patient was discharged from the emergency room on Zofran and Percocet. She received intravenous Decadron in the emergency room prior to discharge.  On 03/13/2015, I evaluated the patient in my office and started her on prednisone 30 mg each morning. Shortly after her office visit, the patient went on a cruise to [place]. She had no significant intestinal symptoms while on her cruise.  The patient is scheduled to undergo a diagnostic colonoscopy today to evaluate Crohn's ileocolitis.  Past medical history: Spondyloarthropathy secondary to Crohn's disease. Crohn's ileocolitis. MGUS. 2004 pancreatitis probably secondary to Azulfidine. Migraine headache syndrome. Erythema nodosum associated with birth control. Allergic rhinitis. April 2011 normal cardiac catheterization. Small vessel vasculitis of the skin diagnosed in 1998. Tonsillectomy. Bilateral hip replacement surgeries.  Medication allergies: Azulfidine caused pancreatitis  Exam: The patient is alert and lying  comfortably on the endoscopy stretcher. Abdomen is soft and nontender to palpation. Lungs are clear to auscultation. Cardiac exam reveals a regular rhythm.  Plan: Proceed with diagnostic colonoscopy

## 2015-04-02 NOTE — Op Note (Signed)
Problem: Crohn's ileocolitis diagnosed in 1994. Terminal ileum-right colon resection to treat intestinal obstruction secondary to Crohn's disease performed in 1994. 2008 normal barium small bowel follow-through x-ray series. 02/06/2015 and 03/10/2015 CT scans of the abdomen and pelvis showed mild mural thickening of the distal ileum without abscess or obstruction.  Endoscopist: Earle Gell  Premedication: Propofol administered by anesthesia  Procedure: Diagnostic colonoscopy The patient was placed in the left lateral decubitus position. The Pentax pediatric colonoscope was passed into the rectum and advanced to the ileo-right colonic surgical anastomosis. The surgical anastomosis was intubated with the colonoscope and the distal ileum inspected. Colonic preparation for the exam today was good. Withdrawal time was 22 minutes  Rectum. Normal. Retroflexed view of the distal rectum was normal  Sigmoid colon and descending colon. Normal  Splenic flexure. Normal  Transverse colon. Normal  Hepatic flexure. Normal  Ascending colon. Normal  Ileo-right colonic surgical anastomosis. Normal  Distal ileum. Normal  Surveillance colon biopsies. Four quadrant biopsies were performed approximately every 10 cm along the length of the colon and rectum to look for mucosal dysplasia. A total of 32 biopsies were performed.  Assessment: Normal colonoscopy to the ileo-right colonic surgical anastomosis with inspection of the distal ileum. No signs of active Crohn's ileocolitis, neoplasia, or obstruction.

## 2015-04-03 ENCOUNTER — Encounter (HOSPITAL_COMMUNITY): Payer: Self-pay | Admitting: Gastroenterology

## 2015-06-20 ENCOUNTER — Other Ambulatory Visit (HOSPITAL_COMMUNITY)
Admission: RE | Admit: 2015-06-20 | Discharge: 2015-06-20 | Disposition: A | Discharge status: Home / Self Care | Payer: BLUE CROSS/BLUE SHIELD | Source: Ambulatory Visit | Attending: Nurse Practitioner | Admitting: Nurse Practitioner

## 2015-06-20 ENCOUNTER — Other Ambulatory Visit: Payer: Self-pay | Admitting: Nurse Practitioner

## 2015-06-20 DIAGNOSIS — Z01419 Encounter for gynecological examination (general) (routine) without abnormal findings: Secondary | ICD-10-CM | POA: Insufficient documentation

## 2015-06-20 DIAGNOSIS — Z1151 Encounter for screening for human papillomavirus (HPV): Secondary | ICD-10-CM | POA: Diagnosis not present

## 2015-06-21 LAB — CYTOLOGY - PAP

## 2015-10-07 ENCOUNTER — Other Ambulatory Visit (HOSPITAL_COMMUNITY): Payer: Self-pay | Admitting: Orthopedic Surgery

## 2015-10-07 DIAGNOSIS — M25551 Pain in right hip: Secondary | ICD-10-CM

## 2015-10-16 ENCOUNTER — Encounter (HOSPITAL_COMMUNITY)
Admission: RE | Admit: 2015-10-16 | Discharge: 2015-10-16 | Disposition: A | Discharge status: Home / Self Care | Payer: 59 | Source: Ambulatory Visit | Attending: Orthopedic Surgery | Admitting: Orthopedic Surgery

## 2015-10-16 DIAGNOSIS — Z96641 Presence of right artificial hip joint: Secondary | ICD-10-CM | POA: Diagnosis not present

## 2015-10-16 DIAGNOSIS — M25551 Pain in right hip: Secondary | ICD-10-CM | POA: Diagnosis not present

## 2015-10-16 MED ORDER — TECHNETIUM TC 99M MEDRONATE IV KIT
25.0000 | PACK | Freq: Once | INTRAVENOUS | Status: AC | PRN
  Administered 2015-10-16: 25.7 via INTRAVENOUS

## 2016-05-23 IMAGING — CT CT ABD-PELV W/ CM
2 of 5 series · 16 of 46 positions shown, 18 images · IV contrast (100 ML OMNI 300)
Comparison: 11/17/2006

CLINICAL DATA: Abdominal pain and cramping, onset at 10 a.m..

EXAM:
CT ABDOMEN AND PELVIS WITH CONTRAST
TECHNIQUE: Multidetector CT imaging of the abdomen and pelvis was performed
using the standard protocol following bolus administration of
intravenous contrast.
CONTRAST:  100mL OMNIPAQUE IOHEXOL 300 MG/ML  SOLN

[Series 2: abd/pel with · axial · 0.61mm/px · z∈[-390,-36]mm · 13 of 85 slices shown, 15 images]
[im 9/85  soft-tissue]
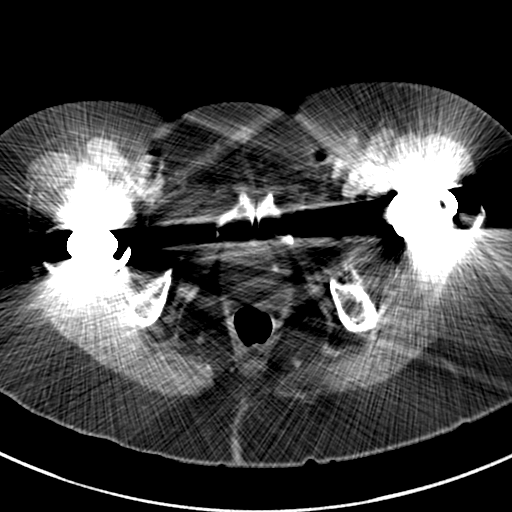
[im 9/85  bone]
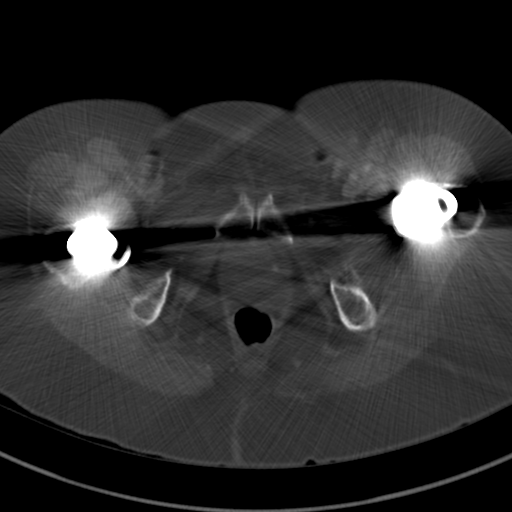
[im 14/85  soft-tissue]
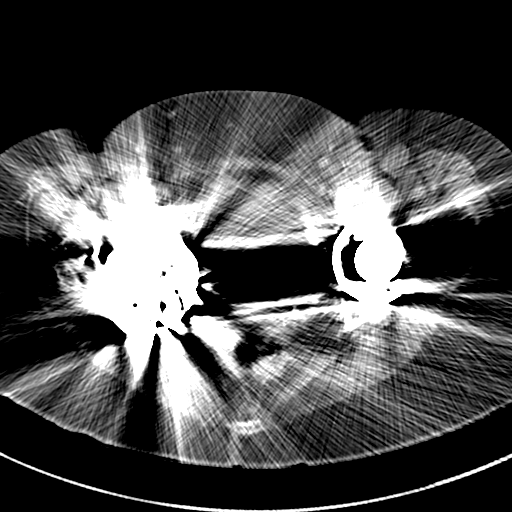
[im 23/85  soft-tissue]
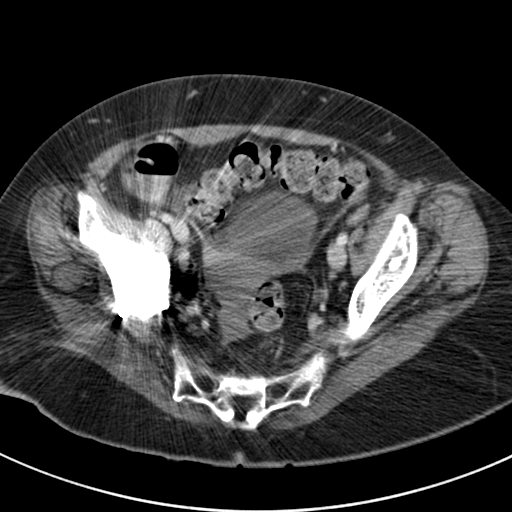
[im 27/85  soft-tissue]
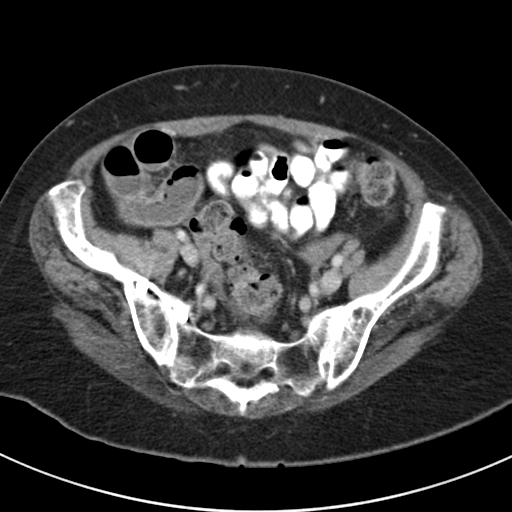
[im 31/85  soft-tissue]
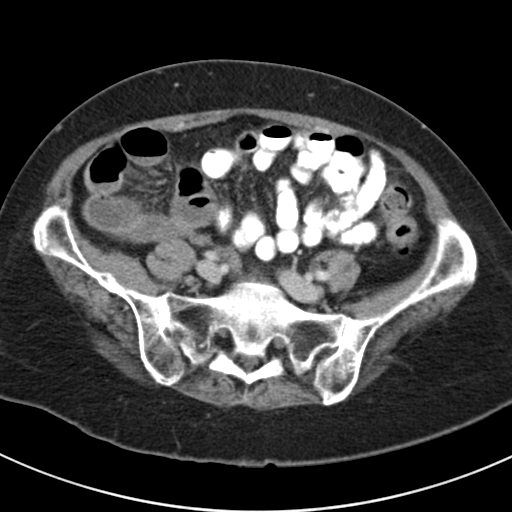
[im 40/85  soft-tissue]
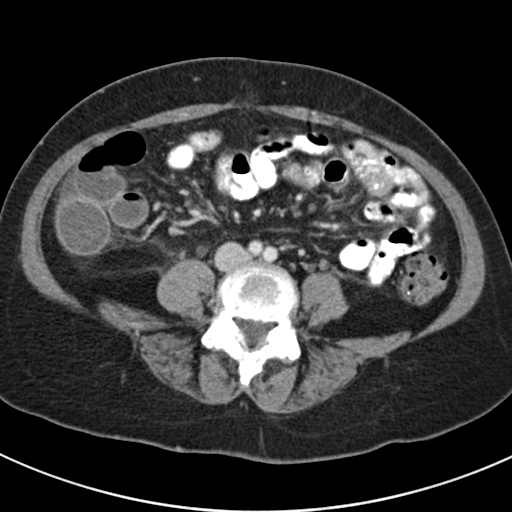
[im 45/85  soft-tissue]
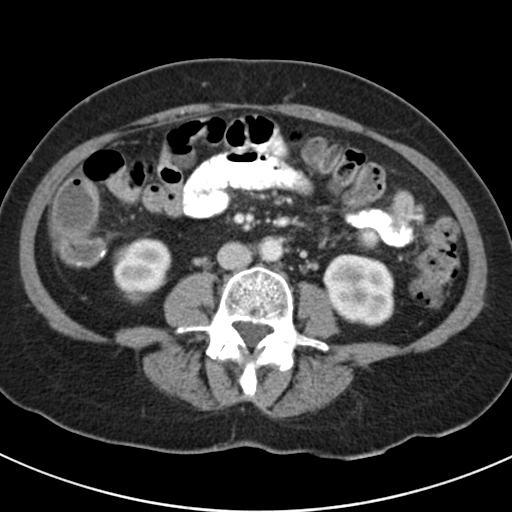
[im 49/85  soft-tissue]
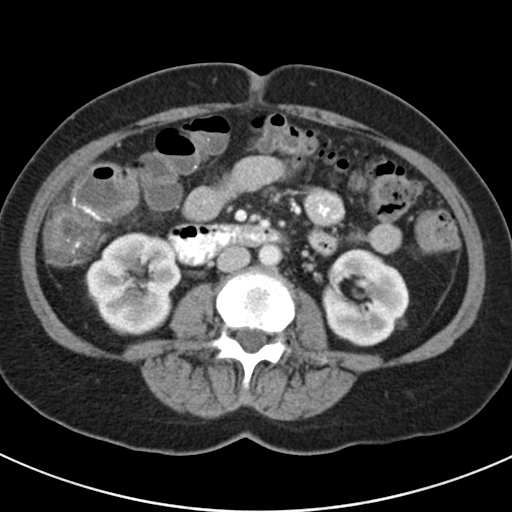
[im 58/85  soft-tissue]
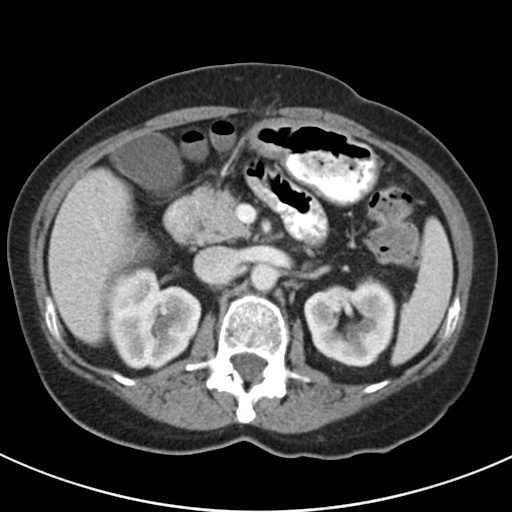
[im 58/85  bone]
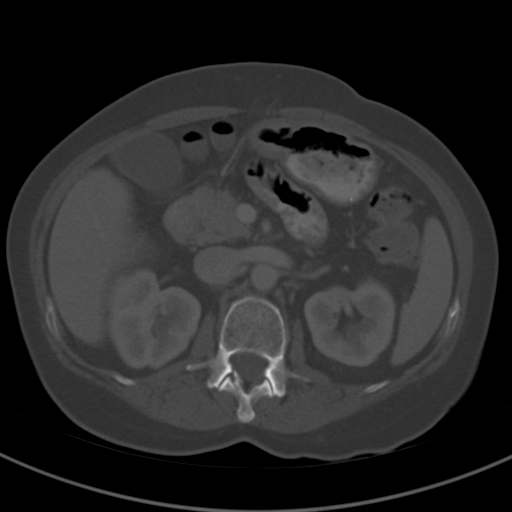
[im 62/85  soft-tissue]
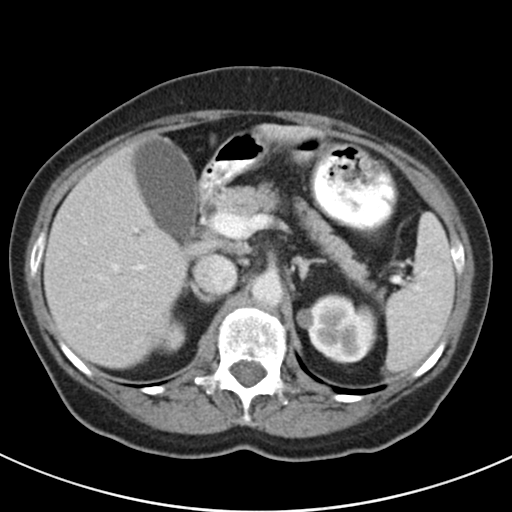
[im 67/85  soft-tissue]
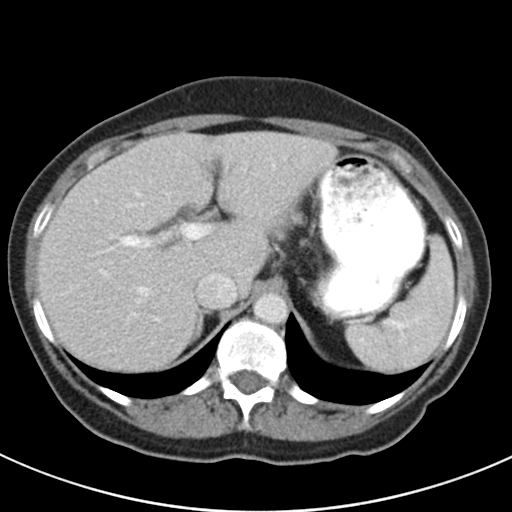
[im 76/85  soft-tissue]
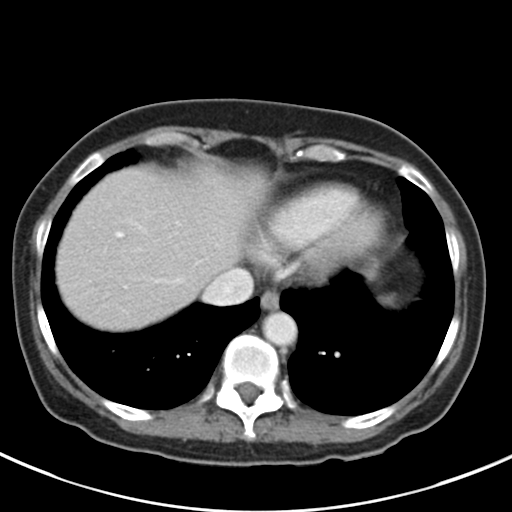
[im 80/85  soft-tissue]
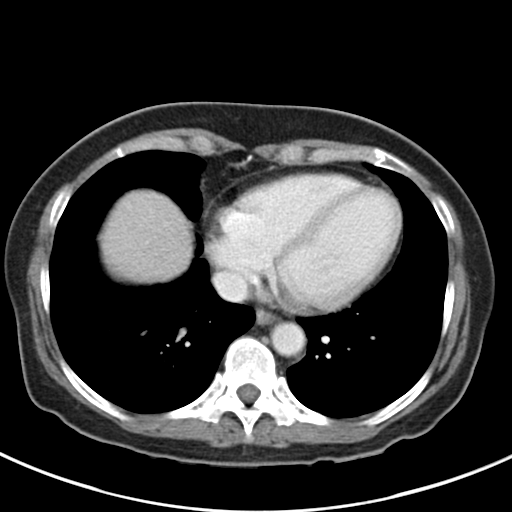

[Series 4: coronal a/|p · coronal · 0.62mm/px · 3 of 77 slices shown]
[im 26/77  soft-tissue]
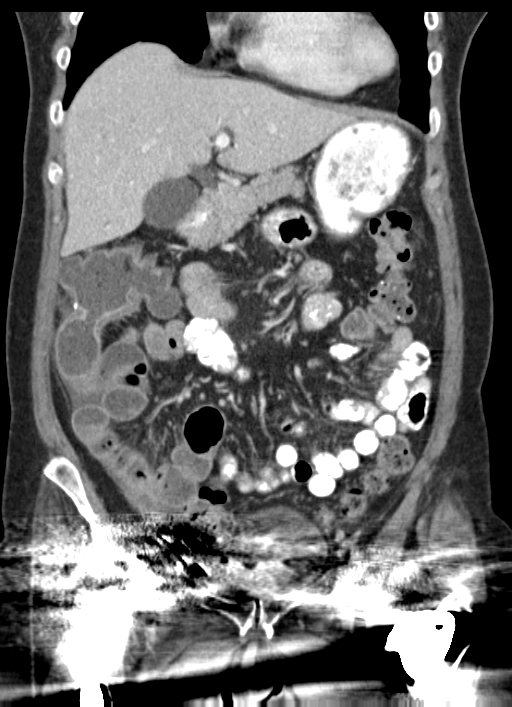
[im 34/77  soft-tissue]
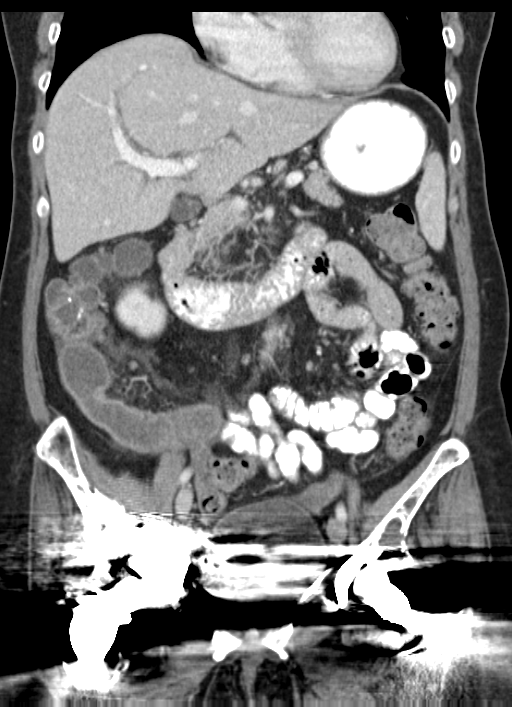
[im 43/77  soft-tissue]
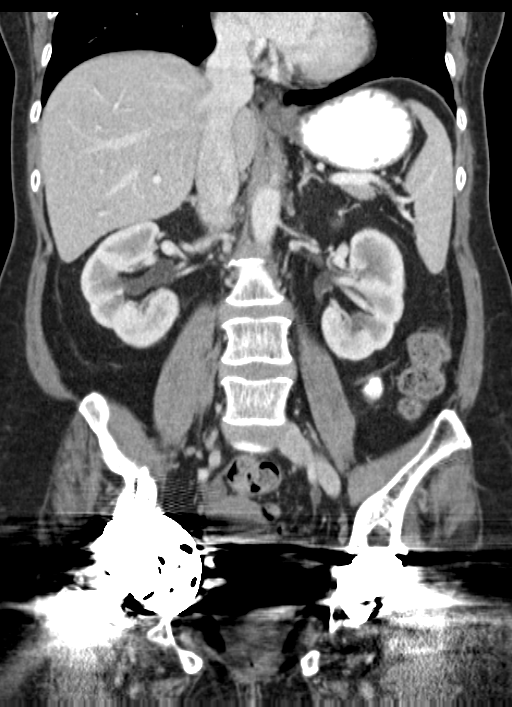

[16 of 46 positions shown; findings below may reference images not displayed]

FINDINGS: There is prior bowel resection with ileocolic anastomosis in the
right abdomen. There is mild mural enhancement of the distal ileum
with adjacent slight inflammation/fluid in the mesentery. There is
no obstruction. There is no abscess. There is no extraluminal air.
The remainder of the bowel is normal in appearance. There is a small
volume ascites.

There are normal appearances of the liver, gallbladder, pancreas,
spleen, adrenals and kidneys.

The abdominal aorta is normal in caliber with no atherosclerotic
calcification.

There is no significant abnormality in the lower chest.

There is mild metal artifact from bilateral hip arthroplasty
hardware. No significant musculoskeletal lesions are evident.

There is no significant abnormality in the lower chest.
IMPRESSION: Mild mural thickening of the distal ileum. Slight adjacent
mesenteric edema. This could represent mild recurrent inflammation
associated with Crohn's disease. No abscess. No obstruction.

## 2016-08-17 ENCOUNTER — Other Ambulatory Visit: Payer: Self-pay | Admitting: Nurse Practitioner

## 2016-08-17 ENCOUNTER — Ambulatory Visit
Admission: RE | Admit: 2016-08-17 | Discharge: 2016-08-17 | Disposition: A | Discharge status: Home / Self Care | Payer: 59 | Source: Ambulatory Visit | Attending: Nurse Practitioner | Admitting: Nurse Practitioner

## 2016-08-17 DIAGNOSIS — R05 Cough: Secondary | ICD-10-CM

## 2016-08-17 DIAGNOSIS — R059 Cough, unspecified: Secondary | ICD-10-CM

## 2016-08-17 DIAGNOSIS — R0789 Other chest pain: Secondary | ICD-10-CM

## 2017-05-19 ENCOUNTER — Encounter (HOSPITAL_COMMUNITY): Payer: Self-pay | Admitting: *Deleted

## 2017-05-19 ENCOUNTER — Emergency Department (HOSPITAL_COMMUNITY)
Admission: EM | Admit: 2017-05-19 | Discharge: 2017-05-19 | Disposition: A | Discharge status: Home / Self Care | Payer: 59 | Attending: Emergency Medicine | Admitting: Emergency Medicine

## 2017-05-19 ENCOUNTER — Emergency Department (HOSPITAL_COMMUNITY): Payer: 59

## 2017-05-19 DIAGNOSIS — R200 Anesthesia of skin: Secondary | ICD-10-CM | POA: Diagnosis not present

## 2017-05-19 DIAGNOSIS — R1084 Generalized abdominal pain: Secondary | ICD-10-CM | POA: Diagnosis present

## 2017-05-19 DIAGNOSIS — K5090 Crohn's disease, unspecified, without complications: Secondary | ICD-10-CM | POA: Diagnosis not present

## 2017-05-19 DIAGNOSIS — Z79899 Other long term (current) drug therapy: Secondary | ICD-10-CM | POA: Insufficient documentation

## 2017-05-19 LAB — CBC
HEMATOCRIT: 44.3 % (ref 36.0–46.0)
Hemoglobin: 14.4 g/dL (ref 12.0–15.0)
MCH: 30.1 pg (ref 26.0–34.0)
MCHC: 32.5 g/dL (ref 30.0–36.0)
MCV: 92.7 fL (ref 78.0–100.0)
PLATELETS: 261 10*3/uL (ref 150–400)
RBC: 4.78 MIL/uL (ref 3.87–5.11)
RDW: 13 % (ref 11.5–15.5)
WBC: 8.5 10*3/uL (ref 4.0–10.5)

## 2017-05-19 LAB — COMPREHENSIVE METABOLIC PANEL
ALT: 17 U/L (ref 14–54)
AST: 22 U/L (ref 15–41)
Albumin: 4.5 g/dL (ref 3.5–5.0)
Alkaline Phosphatase: 60 U/L (ref 38–126)
Anion gap: 10 (ref 5–15)
BILIRUBIN TOTAL: 0.7 mg/dL (ref 0.3–1.2)
BUN: 12 mg/dL (ref 6–20)
CHLORIDE: 111 mmol/L (ref 101–111)
CO2: 20 mmol/L — ABNORMAL LOW (ref 22–32)
CREATININE: 0.51 mg/dL (ref 0.44–1.00)
Calcium: 9.9 mg/dL (ref 8.9–10.3)
Glucose, Bld: 122 mg/dL — ABNORMAL HIGH (ref 65–99)
POTASSIUM: 3.3 mmol/L — AB (ref 3.5–5.1)
Sodium: 141 mmol/L (ref 135–145)
TOTAL PROTEIN: 7.3 g/dL (ref 6.5–8.1)

## 2017-05-19 LAB — I-STAT TROPONIN, ED: Troponin i, poc: 0.01 ng/mL (ref 0.00–0.08)

## 2017-05-19 LAB — URINALYSIS, ROUTINE W REFLEX MICROSCOPIC
Bilirubin Urine: NEGATIVE
GLUCOSE, UA: NEGATIVE mg/dL
Hgb urine dipstick: NEGATIVE
KETONES UR: 20 mg/dL — AB
LEUKOCYTES UA: NEGATIVE
NITRITE: NEGATIVE
Protein, ur: NEGATIVE mg/dL
Specific Gravity, Urine: 1.021 (ref 1.005–1.030)
pH: 7 (ref 5.0–8.0)

## 2017-05-19 LAB — LIPASE, BLOOD: LIPASE: 29 U/L (ref 11–51)

## 2017-05-19 MED ORDER — FENTANYL CITRATE (PF) 100 MCG/2ML IJ SOLN
100.0000 ug | Freq: Once | INTRAMUSCULAR | Status: AC
  Administered 2017-05-19: 100 ug via INTRAVENOUS
  Filled 2017-05-19: qty 2

## 2017-05-19 MED ORDER — DICYCLOMINE HCL 20 MG PO TABS: 20.0000 mg | ORAL_TABLET | Freq: Two times a day (BID) | ORAL | 0 refills | Status: DC

## 2017-05-19 MED ORDER — SODIUM CHLORIDE 0.9 % IV BOLUS (SEPSIS)
1000.0000 mL | Freq: Once | INTRAVENOUS | Status: AC
  Administered 2017-05-19: 1000 mL via INTRAVENOUS

## 2017-05-19 MED ORDER — ONDANSETRON 4 MG PO TBDP
ORAL_TABLET | ORAL | Status: AC
  Filled 2017-05-19: qty 1

## 2017-05-19 MED ORDER — POTASSIUM CHLORIDE CRYS ER 20 MEQ PO TBCR
20.0000 meq | EXTENDED_RELEASE_TABLET | Freq: Once | ORAL | Status: AC
  Administered 2017-05-19: 20 meq via ORAL
  Filled 2017-05-19: qty 1

## 2017-05-19 MED ORDER — ONDANSETRON 4 MG PO TBDP
4.0000 mg | ORAL_TABLET | Freq: Once | ORAL | Status: AC | PRN
  Administered 2017-05-19: 4 mg via ORAL

## 2017-05-19 MED ORDER — ONDANSETRON HCL 4 MG/2ML IJ SOLN
4.0000 mg | Freq: Once | INTRAMUSCULAR | Status: AC
  Administered 2017-05-19: 4 mg via INTRAVENOUS
  Filled 2017-05-19: qty 2

## 2017-05-19 NOTE — ED Provider Notes (Signed)
[facility] DEPT Provider Note   CSN: [id number] Arrival date & time: 05/19/17  4098     History   Chief Complaint Chief Complaint  Patient presents with  . Abdominal Pain  . Numbness    HPI [name] is a 61 y.o. female.  HPI  The pt is a 61 y/o female - she has History of medical history including Crohn's disease status post resection, also has a history of pancreatitis, she has a history of a footdrop on the right brace. He presents with complaints that started last night with abdominal pain mostly in the epigastrium and left upper quadrant which is gradually improved, was associated with an episode of vomiting with some foul-smelling emesis. She has had normal bowel function otherwise with normal stools and passing gas with no urinary symptoms. No fevers or chills, no coughing or shortness of breath. Symptoms are persistent but gradually improving, she hasn't been waiting for several hours in the waiting room and states that she has gradually returned back to a a lower pain level. She also reports that she has had some left arm numbness which has been present for 5 hours. She denies any weakness or other neurologic symptoms with this. She has had some arm numbness in the past including several years ago when she was admitted for left arm symptoms and had a normal heart catheterization as well as as recently as a week ago when she had some vertigo and following that had some left arm paresthesias. She describes it as a stocking glove distribution of the left upper extremity from the shoulder down through the fingers of all 5 fingers.  Past Medical History:  Diagnosis Date  . Arthritis   . Crohn's disease ([place])    03-26-15 Prednisone sarted 2 weeks ago  . Foot drop    right foot-wears brace.  . Migraine   . On prednisone therapy    started 2 weeks ago  . Pancreatitis     There are no active problems to display for this patient.   Past Surgical History:  Procedure  Laterality Date  . BOWEL RESECTION     Crohn's disease  . COLONOSCOPY WITH PROPOFOL N/A 04/02/2015   Procedure: COLONOSCOPY WITH PROPOFOL;  Surgeon: Garlan Fair, MD;  Location: [facility];  Service: Endoscopy;  Laterality: N/A;  . hip replacement x 5     . KNEE SURGERY     scopes  . SHOULDER SURGERY     debridement    OB History    No data available       Home Medications    Prior to Admission medications   Medication Sig Start Date End Date Taking? Authorizing Provider  Multiple Vitamin (MULTI-VITAMINS) TABS Take 1 tablet by mouth daily.   Yes [provider]  oxyCODONE-acetaminophen (PERCOCET) 5-325 MG per tablet Take 1-2 tablets by mouth every 6 (six) hours as needed for severe pain. 03/10/15  Yes Hess, Robyn M, PA-C  promethazine (PHENERGAN) 25 MG tablet Take 1 tablet by mouth every 4 (four) hours as needed. Nausea/migraines   Yes [provider]  SUMAtriptan (IMITREX) 6 MG/0.5ML SOLN injection Inject 6 mLs into the skin as needed. migraines   Yes [provider]  topiramate (TOPAMAX) 25 MG tablet Take 25 mg by mouth at bedtime.    Yes [provider]  dicyclomine (BENTYL) 20 MG tablet Take 1 tablet (20 mg total) by mouth 2 (two) times daily. 05/19/17   Noemi Chapel, MD  ondansetron (  ZOFRAN ODT) 4 MG disintegrating tablet 4mg  ODT q4 hours prn nausea/vomit Patient not taking: Reported on 03/28/2015 03/10/15   Carman Ching, PA-C    Family History Family History  Problem Relation Age of Onset  . Stroke Mother   . Diabetes Mother   . COPD Father   . Arthritis Father     Social History Social History  Substance Use Topics  . Smoking status: Never Smoker  . Smokeless tobacco: Not on file  . Alcohol use Yes     Comment: social      Allergies   Patient has no known allergies.   Review of Systems Review of Systems  All other systems reviewed and are negative.    Physical Exam Updated Vital Signs BP 99/60   Pulse 80    Temp 98.4 F (36.9 C) (Oral)   Resp 17   SpO2 99%   Physical Exam  Constitutional: She appears well-developed and well-nourished. No distress.  HENT:  Head: Normocephalic and atraumatic.  Mouth/Throat: Oropharynx is clear and moist. No oropharyngeal exudate.  Eyes: Pupils are equal, round, and reactive to light. Conjunctivae and EOM are normal. Right eye exhibits no discharge. Left eye exhibits no discharge. No scleral icterus.  Neck: Normal range of motion. Neck supple. No JVD present. No thyromegaly present.  Cardiovascular: Normal rate, regular rhythm, normal heart sounds and intact distal pulses.  Exam reveals no gallop and no friction rub.   No murmur heard. Pulmonary/Chest: Effort normal and breath sounds normal. No respiratory distress. She has no wheezes. She has no rales.  Abdominal: Soft. Bowel sounds are normal. She exhibits no distension and no mass. There is no tenderness.  Musculoskeletal: Normal range of motion. She exhibits no edema or tenderness.  Lymphadenopathy:    She has no cervical adenopathy.  Neurological: She is alert. Coordination normal.  The patient has normal sensation of the bilateral upper extremities, cranial nerves III through XII appear to be intact, the bilateral lower extremities with normal strength and sensation. She does have a slight weakness to the right foot with her history of foot drop. Speech is clear, memory is intact.  Skin: Skin is warm and dry. No rash noted. No erythema.  Psychiatric: She has a normal mood and affect. Her behavior is normal.  Nursing note and vitals reviewed.    ED Treatments / Results  Labs (all labs ordered are listed, but only abnormal results are displayed) Labs Reviewed  COMPREHENSIVE METABOLIC PANEL - Abnormal; Notable for the following:       Result Value   Potassium 3.3 (*)    CO2 20 (*)    Glucose, Bld 122 (*)    All other components within normal limits  URINALYSIS, ROUTINE W REFLEX MICROSCOPIC -  Abnormal; Notable for the following:    APPearance CLOUDY (*)    Ketones, ur 20 (*)    All other components within normal limits  LIPASE, BLOOD  CBC  I-STAT TROPONIN, ED    EKG  EKG Interpretation  Date/Time:  Wednesday May 19 2017 08:25:44 EDT Ventricular Rate:  90 PR Interval:  210 QRS Duration: 90 QT Interval:  376 QTC Calculation: 459 R Axis:   117 Text Interpretation:  Sinus rhythm with 1st degree A-V block Possible Left atrial enlargement Left posterior fascicular block Cannot rule out Inferior infarct , age undetermined ST & T wave abnormality, consider anterior ischemia Abnormal ECG since last tracing no significant change Confirmed by Noemi Chapel [phone]) on 05/19/2017 10:57:09 AM  Radiology Mr Brain Wo Contrast  Result Date: 05/19/2017 CLINICAL DATA:  Left arm numbness EXAM: MRI HEAD WITHOUT CONTRAST TECHNIQUE: Multiplanar, multiecho pulse sequences of the brain and surrounding structures were obtained without intravenous contrast. COMPARISON:  CT 03/01/2007 FINDINGS: Brain: The brain has normal appearance without evidence of malformation, atrophy, old or acute small or large vessel infarction, hemorrhage, hydrocephalus or extra-axial collection. No evidence of demyelinating disease. No pituitary abnormality. Vascular: Major vessels at the base of the brain show flow. Skull and upper cervical spine: Normal Sinuses/Orbits: Clear/ normal. Other: None significant. IMPRESSION: Normal examination. No abnormality seen to explain left arm numbness. Electronically Signed   By: Nelson Chimes M.D.   On: 05/19/2017 15:45    Procedures Procedures (including critical care time)  Medications Ordered in ED Medications  ondansetron (ZOFRAN-ODT) disintegrating tablet 4 mg (4 mg Oral Given 05/19/17 0821)  sodium chloride 0.9 % bolus 1,000 mL (1,000 mLs Intravenous New Bag/Given 05/19/17 1334)  ondansetron (ZOFRAN) injection 4 mg (4 mg Intravenous Given 05/19/17 1335)  fentaNYL  (SUBLIMAZE) injection 100 mcg (100 mcg Intravenous Given 05/19/17 1335)  potassium chloride SA (K-DUR,KLOR-CON) CR tablet 20 mEq (20 mEq Oral Given 05/19/17 1335)     Initial Impression / Assessment and Plan / ED Course  I have reviewed the triage vital signs and the nursing notes.  Pertinent labs & imaging results that were available during my care of the patient were reviewed by me and considered in my medical decision making (see chart for details).    The patient attributes past numbness to paresthesias to her hypokalemia however today she only has a level of 3.3 which does not suggest that this is the cause. She does not have any weakness or other neurologic symptoms however an MRI will be ordered to rule out any acute neurologic injury or ischemic event. The patient has improving abdominal symptoms and labs have been reviewed showing no elevation in her blood counts, mild hypokalemia, and a normal lipase.  Labs and MRI reviewed =- pt informed of results No signs of CVA - stable for d/c.  Final Clinical Impressions(s) / ED Diagnoses   Final diagnoses:  Generalized abdominal pain  Arm numbness left    New Prescriptions New Prescriptions   DICYCLOMINE (BENTYL) 20 MG TABLET    Take 1 tablet (20 mg total) by mouth 2 (two) times daily.     Noemi Chapel, MD 05/19/17 [phone]

## 2017-05-19 NOTE — Discharge Instructions (Signed)
Your testing revealed no specific abnormal findings. Your potassium level was only minimally low. Please seek a medical exam for any severe or worsening symptoms but have your doctor follow-up in the next 48 hours.

## 2017-05-19 NOTE — ED Triage Notes (Signed)
Pt reports hx of crohns with partial bowel removed. Will occ get episodes of severe abd pain and it turns to be "her bowel is twisted" during these episodes will have numbness which is related to her potassium being low. Has generalized abd pain that started last night and numbness to left arm started today. Grips are equal at triage, no neuro deficits noted at triage.

## 2018-09-14 ENCOUNTER — Ambulatory Visit
Admission: RE | Admit: 2018-09-14 | Discharge: 2018-09-14 | Disposition: A | Discharge status: Home / Self Care | Payer: 59 | Source: Ambulatory Visit | Attending: Nurse Practitioner | Admitting: Nurse Practitioner

## 2018-09-14 ENCOUNTER — Other Ambulatory Visit: Payer: Self-pay | Admitting: Nurse Practitioner

## 2018-09-14 DIAGNOSIS — M545 Low back pain, unspecified: Secondary | ICD-10-CM

## 2019-10-11 ENCOUNTER — Encounter (HOSPITAL_COMMUNITY): Payer: Self-pay | Admitting: Sports Medicine

## 2019-10-11 ENCOUNTER — Inpatient Hospital Stay (HOSPITAL_COMMUNITY): Admission: RE | Admit: 2019-10-11 | Payer: 59 | Source: Ambulatory Visit

## 2019-10-11 ENCOUNTER — Other Ambulatory Visit (HOSPITAL_COMMUNITY): Payer: Self-pay | Admitting: Sports Medicine

## 2019-10-11 DIAGNOSIS — M7989 Other specified soft tissue disorders: Secondary | ICD-10-CM

## 2019-10-11 DIAGNOSIS — M79661 Pain in right lower leg: Secondary | ICD-10-CM

## 2019-10-13 ENCOUNTER — Other Ambulatory Visit: Payer: Self-pay

## 2019-10-13 ENCOUNTER — Ambulatory Visit (HOSPITAL_COMMUNITY)
Admission: RE | Admit: 2019-10-13 | Discharge: 2019-10-13 | Disposition: A | Discharge status: Home / Self Care | Payer: 59 | Source: Ambulatory Visit | Attending: Cardiology | Admitting: Cardiology

## 2019-10-13 ENCOUNTER — Encounter (INDEPENDENT_AMBULATORY_CARE_PROVIDER_SITE_OTHER): Payer: Self-pay

## 2019-10-13 DIAGNOSIS — M79661 Pain in right lower leg: Secondary | ICD-10-CM | POA: Diagnosis not present

## 2019-10-13 DIAGNOSIS — M7989 Other specified soft tissue disorders: Secondary | ICD-10-CM

## 2020-03-20 ENCOUNTER — Other Ambulatory Visit: Payer: Self-pay

## 2020-03-20 ENCOUNTER — Ambulatory Visit: Payer: 59 | Admitting: Internal Medicine

## 2020-03-20 ENCOUNTER — Encounter: Payer: Self-pay | Admitting: Internal Medicine

## 2020-03-20 DIAGNOSIS — M21371 Foot drop, right foot: Secondary | ICD-10-CM

## 2020-03-20 DIAGNOSIS — G43709 Chronic migraine without aura, not intractable, without status migrainosus: Secondary | ICD-10-CM

## 2020-03-20 DIAGNOSIS — G43909 Migraine, unspecified, not intractable, without status migrainosus: Secondary | ICD-10-CM | POA: Insufficient documentation

## 2020-03-20 DIAGNOSIS — M1990 Unspecified osteoarthritis, unspecified site: Secondary | ICD-10-CM

## 2020-03-20 DIAGNOSIS — M21379 Foot drop, unspecified foot: Secondary | ICD-10-CM | POA: Insufficient documentation

## 2020-03-20 NOTE — Progress Notes (Signed)
New Patient Office Visit     This visit occurred during the SARS-CoV-2 public health emergency.  Safety protocols were in place, including screening questions prior to the visit, additional usage of staff PPE, and extensive cleaning of exam room while observing appropriate contact time as indicated for disinfecting solutions.    CC/Reason for Visit: Establish care, discuss chronic medical conditions Previous PCP: Lavone Orn, MD Last Visit: 2020  HPI: [name] is a 64 y.o. female who is coming in today for the above mentioned reasons. Past Medical History is significant for: Severe osteoarthritis status post hip replacement x5 with residual foot drop.  She sees a rheumatologist, Dr. Gavin Pound.  She has her on OxyContin 5 mg she has been on the same dose for over 20 years.  She has a history of migraine headaches and sees Dr. Domingo Cocking.  She takes daily Topamax as well as as needed Imitrex and Phenergan.  She also has a history of Crohn's disease not currently on any medications but she has had small bowel resections due to this.  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  She used to be a smoker but quit over 40 years ago, she has 3-4 alcoholic beverages a week, she has no known drug allergies.  Her family history significant for mother with diabetes and ovarian cancer, a father with asthma and a sister with COPD.  She has no acute complaints today.  She had a colonoscopy in 2016 and is a 10-year callback, she has never had a mammogram, her last Pap smear was approximately 5 years ago.   Past Medical/Surgical History: Past Medical History:  Diagnosis Date  . Arthritis   . Crohn's disease ([place])    03-26-15 Prednisone sarted 2 weeks ago  . Foot drop    right foot-wears brace.  . Migraine   . On prednisone therapy    started 2 weeks ago  . Pancreatitis     Past Surgical History:  Procedure Laterality Date  . BOWEL RESECTION      Crohn's disease  . COLONOSCOPY WITH PROPOFOL N/A 04/02/2015   Procedure: COLONOSCOPY WITH PROPOFOL;  Surgeon: Garlan Fair, MD;  Location: [facility];  Service: Endoscopy;  Laterality: N/A;  . hip replacement x 5     . KNEE SURGERY     scopes  . SHOULDER SURGERY     debridement    Social History:  reports that she has never smoked. She has never used smokeless tobacco. She reports current alcohol use. She reports that she does not use drugs.  Allergies: No Known Allergies  Family History:  Family History  Problem Relation Age of Onset  . Stroke Mother   . Diabetes Mother   . COPD Father   . Arthritis Father      Current Outpatient Medications:  .  oxyCODONE-acetaminophen (PERCOCET) 5-325 MG per tablet, Take 1-2 tablets by mouth every 6 (six) hours as needed for severe pain. (Patient taking differently: Take 1-2 tablets by mouth every 6 (six) hours as needed for severe pain. Dr Genia Hotter), Disp: 20 tablet, Rfl: 0 .  promethazine (PHENERGAN) 25 MG tablet, Take 1 tablet by mouth every 4 (four) hours as needed. Nausea/migraines, Disp: , Rfl:  .  SUMAtriptan (IMITREX) 6 MG/0.5ML SOLN injection, Inject 6 mLs into the skin as needed. migraines, Disp: , Rfl:  .  topiramate (TOPAMAX) 25 MG tablet, Take 25 mg by mouth at  bedtime. , Disp: , Rfl:   Review of Systems:  Constitutional: Denies fever, chills, diaphoresis, appetite change and fatigue.  HEENT: Denies photophobia, eye pain, redness, hearing loss, ear pain, congestion, sore throat, rhinorrhea, sneezing, mouth sores, trouble swallowing, neck pain, neck stiffness and tinnitus.   Respiratory: Denies SOB, DOE, cough, chest tightness,  and wheezing.   Cardiovascular: Denies chest pain, palpitations and leg swelling.  Gastrointestinal: Denies nausea, vomiting, abdominal pain, diarrhea, constipation, blood in stool and abdominal distention.  Genitourinary: Denies dysuria, urgency, frequency, hematuria, flank pain and difficulty  urinating.  Endocrine: Denies: hot or cold intolerance, sweats, changes in hair or nails, polyuria, polydipsia. Musculoskeletal: Denies myalgias, back pain, joint swelling, arthralgias and gait problem.  Skin: Denies pallor, rash and wound.  Neurological: Denies dizziness, seizures, syncope, weakness, light-headedness, numbness and headaches.  Hematological: Denies adenopathy. Easy bruising, personal or family bleeding history  Psychiatric/Behavioral: Denies suicidal ideation, mood changes, confusion, nervousness, sleep disturbance and agitation    Physical Exam: Vitals:   03/20/20 1406  BP: 110/74  Pulse: 72  Temp: 97.9 F (36.6 C)  TempSrc: Temporal  SpO2: 96%  Weight: 144 lb 3.2 oz (65.4 kg)  Height: 5\' 1"  (1.549 m)   Body mass index is 27.25 kg/m.  Constitutional: NAD, calm, comfortable Eyes: PERRL, lids and conjunctivae normal ENMT: Mucous membranes are moist.  Respiratory: clear to auscultation bilaterally, no wheezing, no crackles. Normal respiratory effort. No accessory muscle use.  Cardiovascular: Regular rate and rhythm, no murmurs / rubs / gallops. No extremity edema.  Psychiatric: Normal judgment and insight. Alert and oriented x 3. Normal mood.    Impression and Plan:  Chronic migraine without aura without status migrainosus, not intractable -She takes daily Topamax as a preventative, uses Imitrex and Phenergan as needed.  Arthritis -On chronic oxycodone prescribed by rheumatology.  Right foot drop -She wears a brace.  She will return in 3 months for her physical.    Patient Instructions  -Nice seeing you today!!  -see you back in 3 months for your physical. Please come in fasting that day.     Lelon Frohlich, MD [facility]

## 2020-03-20 NOTE — Patient Instructions (Addendum)
-  Nice seeing you today!!  -see you back in 3 months for your physical. Please come in fasting that day.

## 2020-06-26 ENCOUNTER — Other Ambulatory Visit (HOSPITAL_COMMUNITY)
Admission: RE | Admit: 2020-06-26 | Discharge: 2020-06-26 | Disposition: A | Discharge status: Home / Self Care | Payer: 59 | Source: Ambulatory Visit | Attending: Internal Medicine | Admitting: Internal Medicine

## 2020-06-26 ENCOUNTER — Other Ambulatory Visit: Payer: Self-pay

## 2020-06-26 ENCOUNTER — Ambulatory Visit (INDEPENDENT_AMBULATORY_CARE_PROVIDER_SITE_OTHER): Payer: 59 | Admitting: Internal Medicine

## 2020-06-26 ENCOUNTER — Encounter: Payer: Self-pay | Admitting: Internal Medicine

## 2020-06-26 VITALS — BP 110/80 | HR 65 | Temp 97.7°F | Ht 61.0 in | Wt 139.2 lb

## 2020-06-26 DIAGNOSIS — M21371 Foot drop, right foot: Secondary | ICD-10-CM

## 2020-06-26 DIAGNOSIS — G43709 Chronic migraine without aura, not intractable, without status migrainosus: Secondary | ICD-10-CM

## 2020-06-26 DIAGNOSIS — Z124 Encounter for screening for malignant neoplasm of cervix: Secondary | ICD-10-CM

## 2020-06-26 DIAGNOSIS — M1990 Unspecified osteoarthritis, unspecified site: Secondary | ICD-10-CM | POA: Diagnosis not present

## 2020-06-26 DIAGNOSIS — Z0000 Encounter for general adult medical examination without abnormal findings: Secondary | ICD-10-CM

## 2020-06-26 DIAGNOSIS — Z23 Encounter for immunization: Secondary | ICD-10-CM

## 2020-06-26 DIAGNOSIS — K50012 Crohn's disease of small intestine with intestinal obstruction: Secondary | ICD-10-CM

## 2020-06-26 DIAGNOSIS — Z1231 Encounter for screening mammogram for malignant neoplasm of breast: Secondary | ICD-10-CM | POA: Diagnosis not present

## 2020-06-26 LAB — CBC WITH DIFFERENTIAL/PLATELET
Basophils Absolute: 41 cells/uL (ref 0–200)
HCT: 39.7 % (ref 35.0–45.0)
Hemoglobin: 13.3 g/dL (ref 11.7–15.5)
MCV: 94.5 fL (ref 80.0–100.0)

## 2020-06-26 LAB — VITAMIN D 25 HYDROXY (VIT D DEFICIENCY, FRACTURES): Vit D, 25-Hydroxy: 22 ng/mL — ABNORMAL LOW (ref 30–100)

## 2020-06-26 NOTE — Addendum Note (Signed)
Addended by: Westley Hummer B on: 06/26/2020 09:29 AM   Modules accepted: Orders

## 2020-06-26 NOTE — Addendum Note (Signed)
Addended by: Marrion Coy on: 06/26/2020 08:50 AM   Modules accepted: Orders

## 2020-06-26 NOTE — Patient Instructions (Signed)
-Nice seeing you today!!  -Lab work today; will notify you once results are available.  -Pending vaccines: COVID series (2), shingles series (2), flu vaccine.  -Make sure you schedule your eye exam.  -Schedule follow up in 1 year or sooner as needed.   Preventive Care 77-64 Years Old, Female Preventive care refers to visits with your health care provider and lifestyle choices that can promote health and wellness. This includes:  A yearly physical exam. This may also be called an annual well check.  Regular dental visits and eye exams.  Immunizations.  Screening for certain conditions.  Healthy lifestyle choices, such as eating a healthy diet, getting regular exercise, not using drugs or products that contain nicotine and tobacco, and limiting alcohol use. What can I expect for my preventive care visit? Physical exam Your health care provider will check your:  Height and weight. This may be used to calculate body mass index (BMI), which tells if you are at a healthy weight.  Heart rate and blood pressure.  Skin for abnormal spots. Counseling Your health care provider may ask you questions about your:  Alcohol, tobacco, and drug use.  Emotional well-being.  Home and relationship well-being.  Sexual activity.  Eating habits.  Work and work Statistician.  Method of birth control.  Menstrual cycle.  Pregnancy history. What immunizations do I need?  Influenza (flu) vaccine  This is recommended every year. Tetanus, diphtheria, and pertussis (Tdap) vaccine  You may need a Td booster every 10 years. Varicella (chickenpox) vaccine  You may need this if you have not been vaccinated. Zoster (shingles) vaccine  You may need this after age 45. Measles, mumps, and rubella (MMR) vaccine  You may need at least one dose of MMR if you were born in 1957 or later. You may also need a second dose. Pneumococcal conjugate (PCV13) vaccine  You may need this if you have  certain conditions and were not previously vaccinated. Pneumococcal polysaccharide (PPSV23) vaccine  You may need one or two doses if you smoke cigarettes or if you have certain conditions. Meningococcal conjugate (MenACWY) vaccine  You may need this if you have certain conditions. Hepatitis A vaccine  You may need this if you have certain conditions or if you travel or work in places where you may be exposed to hepatitis A. Hepatitis B vaccine  You may need this if you have certain conditions or if you travel or work in places where you may be exposed to hepatitis B. Haemophilus influenzae type b (Hib) vaccine  You may need this if you have certain conditions. Human papillomavirus (HPV) vaccine  If recommended by your health care provider, you may need three doses over 6 months. You may receive vaccines as individual doses or as more than one vaccine together in one shot (combination vaccines). Talk with your health care provider about the risks and benefits of combination vaccines. What tests do I need? Blood tests  Lipid and cholesterol levels. These may be checked every 5 years, or more frequently if you are over 98 years old.  Hepatitis C test.  Hepatitis B test. Screening  Lung cancer screening. You may have this screening every year starting at age 58 if you have a 30-pack-year history of smoking and currently smoke or have quit within the past 15 years.  Colorectal cancer screening. All adults should have this screening starting at age 79 and continuing until age 74. Your health care provider may recommend screening at age 25 if  you are at increased risk. You will have tests every 1-10 years, depending on your results and the type of screening test.  Diabetes screening. This is done by checking your blood sugar (glucose) after you have not eaten for a while (fasting). You may have this done every 1-3 years.  Mammogram. This may be done every 1-2 years. Talk with your  health care provider about when you should start having regular mammograms. This may depend on whether you have a family history of breast cancer.  BRCA-related cancer screening. This may be done if you have a family history of breast, ovarian, tubal, or peritoneal cancers.  Pelvic exam and Pap test. This may be done every 3 years starting at age 21. Starting at age 17, this may be done every 5 years if you have a Pap test in combination with an HPV test. Other tests  Sexually transmitted disease (STD) testing.  Bone density scan. This is done to screen for osteoporosis. You may have this scan if you are at high risk for osteoporosis. Follow these instructions at home: Eating and drinking  Eat a diet that includes fresh fruits and vegetables, whole grains, lean protein, and low-fat dairy.  Take vitamin and mineral supplements as recommended by your health care provider.  Do not drink alcohol if: ? Your health care provider tells you not to drink. ? You are pregnant, may be pregnant, or are planning to become pregnant.  If you drink alcohol: ? Limit how much you have to 0-1 drink a day. ? Be aware of how much alcohol is in your drink. In the [place] one drink equals one 12 oz bottle of beer (355 mL), one 5 oz glass of wine (148 mL), or one 1 oz glass of hard liquor (44 mL). Lifestyle  Take daily care of your teeth and gums.  Stay active. Exercise for at least 30 minutes on 5 or more days each week.  Do not use any products that contain nicotine or tobacco, such as cigarettes, e-cigarettes, and chewing tobacco. If you need help quitting, ask your health care provider.  If you are sexually active, practice safe sex. Use a condom or other form of birth control (contraception) in order to prevent pregnancy and STIs (sexually transmitted infections).  If told by your health care provider, take low-dose aspirin daily starting at age 37. What's next?  Visit your health care provider once  a year for a well check visit.  Ask your health care provider how often you should have your eyes and teeth checked.  Stay up to date on all vaccines. This information is not intended to replace advice given to you by your health care provider. Make sure you discuss any questions you have with your health care provider. Document Revised: 04/28/2018 Document Reviewed: 04/28/2018 Elsevier Patient Education  2020 [employer].

## 2020-06-26 NOTE — Progress Notes (Signed)
Established Patient Office Visit     This visit occurred during the SARS-CoV-2 public health emergency.  Safety protocols were in place, including screening questions prior to the visit, additional usage of staff PPE, and extensive cleaning of exam room while observing appropriate contact time as indicated for disinfecting solutions.    CC/Reason for Visit: Annual preventive exam  HPI: [name] is a 64 y.o. female who is coming in today for the above mentioned reasons. Past Medical History is significant for: Severe osteoarthritis status post hip replacement x5 with residual foot drop.  She sees a rheumatologist, Dr. Gavin Pound.  She has her on OxyContin 5 mg she has been on the same dose for over 20 years.  She has a history of migraine headaches and sees Dr. Domingo Cocking.  She takes daily Topamax as well as as needed Imitrex and Phenergan.  She also has a history of Crohn's disease not currently on any medications but she has had small bowel resections due to this.  She had a colonoscopy in 2016, she is overdue for breast and cervical cancer screening.  She is overdue for Covid, shingles, flu, tetanus vaccines.  She has no acute complaints today.   Past Medical/Surgical History: Past Medical History:  Diagnosis Date   Arthritis    Crohn's disease ([place])    03-26-15 Prednisone sarted 2 weeks ago   Foot drop    right foot-wears brace.   Migraine    On prednisone therapy    started 2 weeks ago   Pancreatitis     Past Surgical History:  Procedure Laterality Date   BOWEL RESECTION     Crohn's disease   COLONOSCOPY WITH PROPOFOL N/A 04/02/2015   Procedure: COLONOSCOPY WITH PROPOFOL;  Surgeon: Garlan Fair, MD;  Location: [facility];  Service: Endoscopy;  Laterality: N/A;   hip replacement x 5      KNEE SURGERY     scopes   SHOULDER SURGERY     debridement    Social History:  reports that she has never smoked. She has never used smokeless tobacco. She  reports current alcohol use. She reports that she does not use drugs.  Allergies: No Known Allergies  Family History:  Family History  Problem Relation Age of Onset   Stroke Mother    Diabetes Mother    COPD Father    Arthritis Father      Current Outpatient Medications:    meclizine (ANTIVERT) 12.5 MG tablet, Take 12.5 mg by mouth 2 (two) times daily as needed., Disp: , Rfl:    oxyCODONE-acetaminophen (PERCOCET) 5-325 MG per tablet, Take 1-2 tablets by mouth every 6 (six) hours as needed for severe pain. (Patient taking differently: Take 1-2 tablets by mouth every 6 (six) hours as needed for severe pain. Dr Genia Hotter), Disp: 20 tablet, Rfl: 0   promethazine (PHENERGAN) 25 MG tablet, Take 1 tablet by mouth every 4 (four) hours as needed. Nausea/migraines, Disp: , Rfl:    SUMAtriptan (IMITREX) 6 MG/0.5ML SOLN injection, Inject 6 mLs into the skin as needed. migraines, Disp: , Rfl:    topiramate (TOPAMAX) 25 MG tablet, Take 25 mg by mouth at bedtime. , Disp: , Rfl:    CIMZIA STARTER KIT 6 X 200 MG/ML KIT, Inject into the skin. (Patient not taking: Reported on 06/26/2020), Disp: , Rfl:   Review of Systems:  Constitutional: Denies fever, chills, diaphoresis, appetite change and fatigue.  HEENT: Denies photophobia, eye pain, redness, hearing loss, ear  pain, congestion, sore throat, rhinorrhea, sneezing, mouth sores, trouble swallowing, neck pain, neck stiffness and tinnitus.   Respiratory: Denies SOB, DOE, cough, chest tightness,  and wheezing.   Cardiovascular: Denies chest pain, palpitations and leg swelling.  Gastrointestinal: Denies nausea, vomiting, abdominal pain, diarrhea, constipation, blood in stool and abdominal distention.  Genitourinary: Denies dysuria, urgency, frequency, hematuria, flank pain and difficulty urinating.  Endocrine: Denies: hot or cold intolerance, sweats, changes in hair or nails, polyuria, polydipsia. Musculoskeletal: Denies myalgias, back pain, joint  swelling, arthralgias and gait problem.  Skin: Denies pallor, rash and wound.  Neurological: Denies dizziness, seizures, syncope, weakness, light-headedness, numbness and headaches.  Hematological: Denies adenopathy. Easy bruising, personal or family bleeding history  Psychiatric/Behavioral: Denies suicidal ideation, mood changes, confusion, nervousness, sleep disturbance and agitation    Physical Exam: Vitals:   06/26/20 0757  BP: 110/80  Pulse: 65  Temp: 97.7 F (36.5 C)  TempSrc: Oral  SpO2: 96%  Weight: 139 lb 3.2 oz (63.1 kg)  Height: 5' 1"  (1.549 m)    Body mass index is 26.3 kg/m.   Constitutional: NAD, calm, comfortable Eyes: PERRL, lids and conjunctivae normal ENMT: Mucous membranes are moist. Posterior pharynx clear of any exudate or lesions. Normal dentition. Tympanic membrane is pearly white, no erythema or bulging. Neck: normal, supple, no masses, no thyromegaly Respiratory: clear to auscultation bilaterally, no wheezing, no crackles. Normal respiratory effort. No accessory muscle use.  Cardiovascular: Regular rate and rhythm, no murmurs / rubs / gallops. No extremity edema. 2+ pedal pulses. No carotid bruits.  Abdomen: no tenderness, no masses palpated. No hepatosplenomegaly. Bowel sounds positive.  Musculoskeletal: no clubbing / cyanosis. No joint deformity upper and lower extremities. Good ROM, no contractures. Normal muscle tone.  Skin: no rashes, lesions, ulcers. No induration Neurologic: CN 2-12 grossly intact. Sensation intact, DTR normal. Strength 5/5 in all 4.  Psychiatric: Normal judgment and insight. Alert and oriented x 3. Normal mood.    Impression and Plan:  Encounter for preventive health examination  -She has routine dental care, have advised routine eye care. -She is due for Tdap, flu, COVID, shingles vaccines.  She has agreed to updating Tdap only today. -Screening labs today. -Healthy lifestyle discussed in detail. -She had a colonoscopy in  2016 and is a 10-year callback. -She is overdue for mammogram, I will request today. -Pap smear done in office today.  Encounter for screening mammogram for malignant neoplasm of breast  - Plan: MM Digital Screening  Chronic migraine without aura without status migrainosus, not intractable -No recent flareups, she is on daily Topamax.  Arthritis -Followed by rheumatology, she tells me she will be started on a biologic soon.  Right foot drop  -Wears AFO  Need for Tdap vaccination -Tdap administered today.  Crohn's disease of small intestine with intestinal obstruction ([place])  -She was recently hospitalized for small bowel obstruction to Crohn's disease that resolved with conservative measures.  Cervical cancer screening -Pap smear done in office today.    Patient Instructions  -Nice seeing you today!!  -Lab work today; will notify you once results are available.  -Pending vaccines: COVID series (2), shingles series (2), flu vaccine.  -Make sure you schedule your eye exam.  -Schedule follow up in 1 year or sooner as needed.   Preventive Care 2-78 Years Old, Female Preventive care refers to visits with your health care provider and lifestyle choices that can promote health and wellness. This includes:  A yearly physical exam. This may also be called  an annual well check.  Regular dental visits and eye exams.  Immunizations.  Screening for certain conditions.  Healthy lifestyle choices, such as eating a healthy diet, getting regular exercise, not using drugs or products that contain nicotine and tobacco, and limiting alcohol use. What can I expect for my preventive care visit? Physical exam Your health care provider will check your:  Height and weight. This may be used to calculate body mass index (BMI), which tells if you are at a healthy weight.  Heart rate and blood pressure.  Skin for abnormal spots. Counseling Your health care provider may ask you  questions about your:  Alcohol, tobacco, and drug use.  Emotional well-being.  Home and relationship well-being.  Sexual activity.  Eating habits.  Work and work Statistician.  Method of birth control.  Menstrual cycle.  Pregnancy history. What immunizations do I need?  Influenza (flu) vaccine  This is recommended every year. Tetanus, diphtheria, and pertussis (Tdap) vaccine  You may need a Td booster every 10 years. Varicella (chickenpox) vaccine  You may need this if you have not been vaccinated. Zoster (shingles) vaccine  You may need this after age 70. Measles, mumps, and rubella (MMR) vaccine  You may need at least one dose of MMR if you were born in 12-Mar-1956 or later. You may also need a second dose. Pneumococcal conjugate (PCV13) vaccine  You may need this if you have certain conditions and were not previously vaccinated. Pneumococcal polysaccharide (PPSV23) vaccine  You may need one or two doses if you smoke cigarettes or if you have certain conditions. Meningococcal conjugate (MenACWY) vaccine  You may need this if you have certain conditions. Hepatitis A vaccine  You may need this if you have certain conditions or if you travel or work in places where you may be exposed to hepatitis A. Hepatitis B vaccine  You may need this if you have certain conditions or if you travel or work in places where you may be exposed to hepatitis B. Haemophilus influenzae type b (Hib) vaccine  You may need this if you have certain conditions. Human papillomavirus (HPV) vaccine  If recommended by your health care provider, you may need three doses over 6 months. You may receive vaccines as individual doses or as more than one vaccine together in one shot (combination vaccines). Talk with your health care provider about the risks and benefits of combination vaccines. What tests do I need? Blood tests  Lipid and cholesterol levels. These may be checked every 5 years, or more  frequently if you are over 85 years old.  Hepatitis C test.  Hepatitis B test. Screening  Lung cancer screening. You may have this screening every year starting at age 20 if you have a 30-pack-year history of smoking and currently smoke or have quit within the past 15 years.  Colorectal cancer screening. All adults should have this screening starting at age 72 and continuing until age 64. Your health care provider may recommend screening at age 39 if you are at increased risk. You will have tests every 1-10 years, depending on your results and the type of screening test.  Diabetes screening. This is done by checking your blood sugar (glucose) after you have not eaten for a while (fasting). You may have this done every 1-3 years.  Mammogram. This may be done every 1-2 years. Talk with your health care provider about when you should start having regular mammograms. This may depend on whether you have a family  history of breast cancer.  BRCA-related cancer screening. This may be done if you have a family history of breast, ovarian, tubal, or peritoneal cancers.  Pelvic exam and Pap test. This may be done every 3 years starting at age 34. Starting at age 37, this may be done every 5 years if you have a Pap test in combination with an HPV test. Other tests  Sexually transmitted disease (STD) testing.  Bone density scan. This is done to screen for osteoporosis. You may have this scan if you are at high risk for osteoporosis. Follow these instructions at home: Eating and drinking  Eat a diet that includes fresh fruits and vegetables, whole grains, lean protein, and low-fat dairy.  Take vitamin and mineral supplements as recommended by your health care provider.  Do not drink alcohol if: ? Your health care provider tells you not to drink. ? You are pregnant, may be pregnant, or are planning to become pregnant.  If you drink alcohol: ? Limit how much you have to 0-1 drink a day. ? Be aware  of how much alcohol is in your drink. In the [place] one drink equals one 12 oz bottle of beer (355 mL), one 5 oz glass of wine (148 mL), or one 1 oz glass of hard liquor (44 mL). Lifestyle  Take daily care of your teeth and gums.  Stay active. Exercise for at least 30 minutes on 5 or more days each week.  Do not use any products that contain nicotine or tobacco, such as cigarettes, e-cigarettes, and chewing tobacco. If you need help quitting, ask your health care provider.  If you are sexually active, practice safe sex. Use a condom or other form of birth control (contraception) in order to prevent pregnancy and STIs (sexually transmitted infections).  If told by your health care provider, take low-dose aspirin daily starting at age 66. What's next?  Visit your health care provider once a year for a well check visit.  Ask your health care provider how often you should have your eyes and teeth checked.  Stay up to date on all vaccines. This information is not intended to replace advice given to you by your health care provider. Make sure you discuss any questions you have with your health care provider. Document Revised: 04/28/2018 Document Reviewed: 04/28/2018 Elsevier Patient Education  2020 [place], [place] [facility]

## 2020-06-27 ENCOUNTER — Encounter: Payer: Self-pay | Admitting: Internal Medicine

## 2020-06-27 ENCOUNTER — Other Ambulatory Visit: Payer: Self-pay | Admitting: Internal Medicine

## 2020-06-27 DIAGNOSIS — E559 Vitamin D deficiency, unspecified: Secondary | ICD-10-CM | POA: Insufficient documentation

## 2020-06-27 LAB — CBC WITH DIFFERENTIAL/PLATELET
Absolute Monocytes: 458 cells/uL (ref 200–950)
Basophils Relative: 0.7 %
Eosinophils Absolute: 41 cells/uL (ref 15–500)
Eosinophils Relative: 0.7 %
Lymphs Abs: 1415 cells/uL (ref 850–3900)
MCH: 31.7 pg (ref 27.0–33.0)
MCHC: 33.5 g/dL (ref 32.0–36.0)
MPV: 10.9 fL (ref 7.5–12.5)
Monocytes Relative: 7.9 %
Neutro Abs: 3845 cells/uL (ref 1500–7800)
Neutrophils Relative %: 66.3 %
Platelets: 226 10*3/uL (ref 140–400)
RBC: 4.2 10*6/uL (ref 3.80–5.10)
RDW: 12.6 % (ref 11.0–15.0)
Total Lymphocyte: 24.4 %
WBC: 5.8 10*3/uL (ref 3.8–10.8)

## 2020-06-27 LAB — COMPREHENSIVE METABOLIC PANEL
AG Ratio: 1.8 (calc) (ref 1.0–2.5)
ALT: 9 U/L (ref 6–29)
AST: 12 U/L (ref 10–35)
Albumin: 4.6 g/dL (ref 3.6–5.1)
Alkaline phosphatase (APISO): 59 U/L (ref 37–153)
BUN/Creatinine Ratio: 38 (calc) — ABNORMAL HIGH (ref 6–22)
BUN: 15 mg/dL (ref 7–25)
CO2: 19 mmol/L — ABNORMAL LOW (ref 20–32)
Calcium: 9.6 mg/dL (ref 8.6–10.4)
Chloride: 111 mmol/L — ABNORMAL HIGH (ref 98–110)
Creat: 0.4 mg/dL — ABNORMAL LOW (ref 0.50–0.99)
Globulin: 2.5 g/dL (calc) (ref 1.9–3.7)
Glucose, Bld: 79 mg/dL (ref 65–99)
Potassium: 3.8 mmol/L (ref 3.5–5.3)
Sodium: 142 mmol/L (ref 135–146)
Total Bilirubin: 0.4 mg/dL (ref 0.2–1.2)
Total Protein: 7.1 g/dL (ref 6.1–8.1)

## 2020-06-27 LAB — LIPID PANEL
Cholesterol: 203 mg/dL — ABNORMAL HIGH (ref <200)
HDL: 90 mg/dL (ref >=50)
LDL Cholesterol (Calc): 94 mg/dL (calc)
Non-HDL Cholesterol (Calc): 113 mg/dL (calc) (ref <130)
Total CHOL/HDL Ratio: 2.3 (calc) (ref <5.0)
Triglycerides: 91 mg/dL (ref <150)

## 2020-06-27 LAB — VITAMIN B12: Vitamin B-12: 321 pg/mL (ref 200–1100)

## 2020-06-27 LAB — HEMOGLOBIN A1C
Hgb A1c MFr Bld: 5 % of total Hgb (ref <5.7)
Mean Plasma Glucose: 97 (calc)
eAG (mmol/L): 5.4 (calc)

## 2020-06-27 LAB — TSH: TSH: 1.09 mIU/L (ref 0.40–4.50)

## 2020-06-27 MED ORDER — VITAMIN D (ERGOCALCIFEROL) 1.25 MG (50000 UNIT) PO CAPS: 50000.0000 [IU] | ORAL_CAPSULE | ORAL | 0 refills | Status: AC

## 2020-06-28 LAB — CYTOLOGY - PAP
Comment: NEGATIVE
Diagnosis: NEGATIVE
High risk HPV: NEGATIVE

## 2020-11-27 DIAGNOSIS — R519 Headache, unspecified: Secondary | ICD-10-CM | POA: Diagnosis not present

## 2020-11-27 DIAGNOSIS — G43719 Chronic migraine without aura, intractable, without status migrainosus: Secondary | ICD-10-CM | POA: Diagnosis not present

## 2020-11-27 DIAGNOSIS — G43019 Migraine without aura, intractable, without status migrainosus: Secondary | ICD-10-CM | POA: Diagnosis not present

## 2020-11-29 DIAGNOSIS — M2550 Pain in unspecified joint: Secondary | ICD-10-CM | POA: Diagnosis not present

## 2020-12-04 DIAGNOSIS — H524 Presbyopia: Secondary | ICD-10-CM | POA: Diagnosis not present

## 2020-12-04 DIAGNOSIS — H3500 Unspecified background retinopathy: Secondary | ICD-10-CM | POA: Diagnosis not present

## 2020-12-04 DIAGNOSIS — H52203 Unspecified astigmatism, bilateral: Secondary | ICD-10-CM | POA: Diagnosis not present

## 2020-12-17 DIAGNOSIS — K50918 Crohn's disease, unspecified, with other complication: Secondary | ICD-10-CM | POA: Diagnosis not present

## 2020-12-17 DIAGNOSIS — M2550 Pain in unspecified joint: Secondary | ICD-10-CM | POA: Diagnosis not present

## 2020-12-17 DIAGNOSIS — Z79899 Other long term (current) drug therapy: Secondary | ICD-10-CM | POA: Diagnosis not present

## 2020-12-17 DIAGNOSIS — M459 Ankylosing spondylitis of unspecified sites in spine: Secondary | ICD-10-CM | POA: Diagnosis not present

## 2020-12-17 DIAGNOSIS — E663 Overweight: Secondary | ICD-10-CM | POA: Diagnosis not present

## 2020-12-17 DIAGNOSIS — Z6827 Body mass index (BMI) 27.0-27.9, adult: Secondary | ICD-10-CM | POA: Diagnosis not present

## 2020-12-18 DIAGNOSIS — M21371 Foot drop, right foot: Secondary | ICD-10-CM | POA: Diagnosis not present

## 2021-01-01 DIAGNOSIS — H3500 Unspecified background retinopathy: Secondary | ICD-10-CM | POA: Diagnosis not present

## 2021-01-11 DIAGNOSIS — Z0100 Encounter for examination of eyes and vision without abnormal findings: Secondary | ICD-10-CM | POA: Diagnosis not present

## 2021-02-14 DIAGNOSIS — Z96642 Presence of left artificial hip joint: Secondary | ICD-10-CM | POA: Diagnosis not present

## 2021-02-14 DIAGNOSIS — M545 Low back pain, unspecified: Secondary | ICD-10-CM | POA: Diagnosis not present

## 2021-02-17 DIAGNOSIS — M545 Low back pain, unspecified: Secondary | ICD-10-CM | POA: Diagnosis not present

## 2021-02-20 ENCOUNTER — Other Ambulatory Visit: Payer: Self-pay

## 2021-02-20 ENCOUNTER — Other Ambulatory Visit (HOSPITAL_COMMUNITY)
Admission: RE | Admit: 2021-02-20 | Discharge: 2021-02-20 | Disposition: A | Discharge status: Home / Self Care | Payer: Medicare HMO | Source: Ambulatory Visit | Attending: Internal Medicine | Admitting: Internal Medicine

## 2021-02-20 ENCOUNTER — Encounter: Payer: Self-pay | Admitting: Internal Medicine

## 2021-02-20 ENCOUNTER — Ambulatory Visit (INDEPENDENT_AMBULATORY_CARE_PROVIDER_SITE_OTHER): Payer: Medicare HMO | Admitting: Internal Medicine

## 2021-02-20 VITALS — BP 110/70 | HR 82 | Temp 97.9°F | Wt 143.2 lb

## 2021-02-20 DIAGNOSIS — Z113 Encounter for screening for infections with a predominantly sexual mode of transmission: Secondary | ICD-10-CM | POA: Insufficient documentation

## 2021-02-20 DIAGNOSIS — R102 Pelvic and perineal pain: Secondary | ICD-10-CM | POA: Diagnosis not present

## 2021-02-20 DIAGNOSIS — R300 Dysuria: Secondary | ICD-10-CM | POA: Insufficient documentation

## 2021-02-20 DIAGNOSIS — Z1239 Encounter for other screening for malignant neoplasm of breast: Secondary | ICD-10-CM | POA: Diagnosis not present

## 2021-02-20 DIAGNOSIS — R69 Illness, unspecified: Secondary | ICD-10-CM | POA: Diagnosis not present

## 2021-02-20 LAB — POCT URINALYSIS DIPSTICK
Bilirubin, UA: NEGATIVE
Blood, UA: NEGATIVE
Glucose, UA: NEGATIVE
Ketones, UA: NEGATIVE
Leukocytes, UA: NEGATIVE
Nitrite, UA: NEGATIVE
Protein, UA: NEGATIVE
Spec Grav, UA: 1.015 (ref 1.010–1.025)
Urobilinogen, UA: 0.2 E.U./dL
pH, UA: 6 (ref 5.0–8.0)

## 2021-02-20 NOTE — Progress Notes (Signed)
Acute office Visit     This visit occurred during the SARS-CoV-2 public health emergency.  Safety protocols were in place, including screening questions prior to the visit, additional usage of staff PPE, and extensive cleaning of exam room while observing appropriate contact time as indicated for disinfecting solutions.    CC/Reason for Visit: Vaginal pain  HPI: [name] is a 65 y.o. female who is coming in today for the above mentioned reasons.  She is here to discuss some symptoms that are very vague to describe.  She feels a sense of vaginal pain and pressure with abdominal bloating for about a week.  She thought she might have a urinary tract infection although she does not have classic dysuria, frequency or hesitancy.  She has not noticed any discharge or rash.  She has not been sexually active in about a year.  She tells me she has been having normal bowel movements about once every day to every other day.  Past Medical/Surgical History: Past Medical History:  Diagnosis Date   Arthritis    Crohn's disease ([place])    03-26-15 Prednisone sarted 2 weeks ago   Foot drop    right foot-wears brace.   Migraine    On prednisone therapy    started 2 weeks ago   Pancreatitis     Past Surgical History:  Procedure Laterality Date   BOWEL RESECTION     Crohn's disease   COLONOSCOPY WITH PROPOFOL N/A 04/02/2015   Procedure: COLONOSCOPY WITH PROPOFOL;  Surgeon: Garlan Fair, MD;  Location: [facility];  Service: Endoscopy;  Laterality: N/A;   hip replacement x 5      KNEE SURGERY     scopes   SHOULDER SURGERY     debridement    Social History:  reports that she has never smoked. She has never used smokeless tobacco. She reports current alcohol use. She reports that she does not use drugs.  Allergies: No Known Allergies  Family History:  Family History  Problem Relation Age of Onset   Stroke Mother    Diabetes Mother    COPD Father    Arthritis Father       Current Outpatient Medications:    meclizine (ANTIVERT) 12.5 MG tablet, Take 12.5 mg by mouth 2 (two) times daily as needed., Disp: , Rfl:    oxyCODONE-acetaminophen (PERCOCET) 5-325 MG per tablet, Take 1-2 tablets by mouth every 6 (six) hours as needed for severe pain. (Patient taking differently: Take 1-2 tablets by mouth every 6 (six) hours as needed for severe pain. Dr Genia Hotter), Disp: 20 tablet, Rfl: 0   promethazine (PHENERGAN) 25 MG tablet, Take 1 tablet by mouth every 4 (four) hours as needed. Nausea/migraines, Disp: , Rfl:    SUMAtriptan (IMITREX) 6 MG/0.5ML SOLN injection, Inject 6 mLs into the skin as needed. migraines, Disp: , Rfl:    topiramate (TOPAMAX) 25 MG tablet, Take 25 mg by mouth at bedtime. , Disp: , Rfl:    CIMZIA STARTER KIT 6 X 200 MG/ML KIT, Inject into the skin. (Patient not taking: No sig reported), Disp: , Rfl:   Review of Systems:  Constitutional: Denies fever, chills, diaphoresis, appetite change and fatigue.  HEENT: Denies photophobia, eye pain, redness, hearing loss, ear pain, congestion, sore throat, rhinorrhea, sneezing, mouth sores, trouble swallowing, neck pain, neck stiffness and tinnitus.   Respiratory: Denies SOB, DOE, cough, chest tightness,  and wheezing.   Cardiovascular: Denies chest pain, palpitations and leg swelling.  Gastrointestinal: Denies nausea, vomiting, abdominal pain, diarrhea, constipation, blood in stool and abdominal distention.  Genitourinary: Denies dysuria, urgency, frequency, hematuria, flank pain and difficulty urinating.  Endocrine: Denies: hot or cold intolerance, sweats, changes in hair or nails, polyuria, polydipsia. Musculoskeletal: Denies myalgias, back pain, joint swelling, arthralgias and gait problem.  Skin: Denies pallor, rash and wound.  Neurological: Denies dizziness, seizures, syncope, weakness, light-headedness, numbness and headaches.  Hematological: Denies adenopathy. Easy bruising, personal or family bleeding  history  Psychiatric/Behavioral: Denies suicidal ideation, mood changes, confusion, nervousness, sleep disturbance and agitation    Physical Exam: Vitals:   02/20/21 1605  BP: 110/70  Pulse: 82  Temp: 97.9 F (36.6 C)  TempSrc: Oral  SpO2: 97%  Weight: 143 lb 3.2 oz (65 kg)    Body mass index is 27.06 kg/m.   Constitutional: NAD, calm, comfortable Eyes: PERRL, lids and conjunctivae normal ENMT: Mucous membranes are moist.  Abdominal: Soft, slightly tender to palpation to bilateral lower quadrants, no masses or organomegaly, no rebound, no guarding. Neurologic: Grossly intact and nonfocal Psychiatric: Normal judgment and insight. Alert and oriented x 3. Normal mood.    Impression and Plan:  Screening breast examination  - Plan: MM Digital Screening  Vaginal pain -Etiology remains unclear to me. -Urine dipstick is negative for blood, nitrates.  I will send for urine culture. -No rash or abnormality noted on external genitalia inspection. -Speculum exam with wet prep has also been performed.  Anatomically normal with some signs of atrophic vaginitis. -Await results of testing.  If pain continues can consider ultrasound/CT scan for further evaluation.  Time spent: 32 minutes reviewing chart, interviewing and examining patient and formulating plan of care.      Lelon Frohlich, MD [facility]

## 2021-02-21 ENCOUNTER — Ambulatory Visit: Payer: 59 | Admitting: Internal Medicine

## 2021-02-21 LAB — URINE CULTURE
MICRO NUMBER:: 12042629
SPECIMEN QUALITY:: ADEQUATE

## 2021-02-24 LAB — CERVICOVAGINAL ANCILLARY ONLY
Bacterial Vaginitis (gardnerella): NEGATIVE
Candida Glabrata: NEGATIVE
Candida Vaginitis: NEGATIVE
Chlamydia: NEGATIVE
Comment: NEGATIVE
Comment: NEGATIVE
Comment: NEGATIVE
Comment: NEGATIVE
Comment: NEGATIVE
Comment: NORMAL
Neisseria Gonorrhea: NEGATIVE
Trichomonas: NEGATIVE

## 2021-03-19 DIAGNOSIS — K50918 Crohn's disease, unspecified, with other complication: Secondary | ICD-10-CM | POA: Diagnosis not present

## 2021-03-19 DIAGNOSIS — Z6826 Body mass index (BMI) 26.0-26.9, adult: Secondary | ICD-10-CM | POA: Diagnosis not present

## 2021-03-19 DIAGNOSIS — E663 Overweight: Secondary | ICD-10-CM | POA: Diagnosis not present

## 2021-03-19 DIAGNOSIS — M2550 Pain in unspecified joint: Secondary | ICD-10-CM | POA: Diagnosis not present

## 2021-03-19 DIAGNOSIS — M459 Ankylosing spondylitis of unspecified sites in spine: Secondary | ICD-10-CM | POA: Diagnosis not present

## 2021-03-19 DIAGNOSIS — Z79899 Other long term (current) drug therapy: Secondary | ICD-10-CM | POA: Diagnosis not present

## 2021-04-08 DIAGNOSIS — M459 Ankylosing spondylitis of unspecified sites in spine: Secondary | ICD-10-CM | POA: Diagnosis not present

## 2021-05-20 ENCOUNTER — Ambulatory Visit: Payer: Medicare HMO

## 2021-06-11 DIAGNOSIS — G43719 Chronic migraine without aura, intractable, without status migrainosus: Secondary | ICD-10-CM | POA: Diagnosis not present

## 2021-06-11 DIAGNOSIS — R519 Headache, unspecified: Secondary | ICD-10-CM | POA: Diagnosis not present

## 2021-06-11 DIAGNOSIS — G43019 Migraine without aura, intractable, without status migrainosus: Secondary | ICD-10-CM | POA: Diagnosis not present

## 2021-06-17 DIAGNOSIS — E663 Overweight: Secondary | ICD-10-CM | POA: Diagnosis not present

## 2021-06-17 DIAGNOSIS — Z79899 Other long term (current) drug therapy: Secondary | ICD-10-CM | POA: Diagnosis not present

## 2021-06-17 DIAGNOSIS — Z6827 Body mass index (BMI) 27.0-27.9, adult: Secondary | ICD-10-CM | POA: Diagnosis not present

## 2021-06-17 DIAGNOSIS — K50918 Crohn's disease, unspecified, with other complication: Secondary | ICD-10-CM | POA: Diagnosis not present

## 2021-06-17 DIAGNOSIS — M2550 Pain in unspecified joint: Secondary | ICD-10-CM | POA: Diagnosis not present

## 2021-06-17 DIAGNOSIS — M4690 Unspecified inflammatory spondylopathy, site unspecified: Secondary | ICD-10-CM | POA: Diagnosis not present

## 2021-06-20 DIAGNOSIS — H3500 Unspecified background retinopathy: Secondary | ICD-10-CM | POA: Diagnosis not present

## 2021-07-04 ENCOUNTER — Encounter (HOSPITAL_COMMUNITY): Payer: Self-pay | Admitting: Emergency Medicine

## 2021-07-04 ENCOUNTER — Emergency Department (HOSPITAL_COMMUNITY): Payer: Medicare HMO

## 2021-07-04 ENCOUNTER — Inpatient Hospital Stay (HOSPITAL_COMMUNITY)
Admission: EM | Admit: 2021-07-04 | Discharge: 2021-07-06 | DRG: 386 | Disposition: A | Discharge status: Home / Self Care | Payer: Medicare HMO | Attending: Internal Medicine | Admitting: Internal Medicine

## 2021-07-04 DIAGNOSIS — N281 Cyst of kidney, acquired: Secondary | ICD-10-CM | POA: Diagnosis not present

## 2021-07-04 DIAGNOSIS — K838 Other specified diseases of biliary tract: Secondary | ICD-10-CM | POA: Diagnosis not present

## 2021-07-04 DIAGNOSIS — E872 Acidosis, unspecified: Secondary | ICD-10-CM | POA: Diagnosis present

## 2021-07-04 DIAGNOSIS — G43909 Migraine, unspecified, not intractable, without status migrainosus: Secondary | ICD-10-CM | POA: Diagnosis present

## 2021-07-04 DIAGNOSIS — K50012 Crohn's disease of small intestine with intestinal obstruction: Principal | ICD-10-CM | POA: Diagnosis present

## 2021-07-04 DIAGNOSIS — K5090 Crohn's disease, unspecified, without complications: Secondary | ICD-10-CM | POA: Diagnosis present

## 2021-07-04 DIAGNOSIS — Z823 Family history of stroke: Secondary | ICD-10-CM

## 2021-07-04 DIAGNOSIS — Z825 Family history of asthma and other chronic lower respiratory diseases: Secondary | ICD-10-CM

## 2021-07-04 DIAGNOSIS — Z833 Family history of diabetes mellitus: Secondary | ICD-10-CM

## 2021-07-04 DIAGNOSIS — M1990 Unspecified osteoarthritis, unspecified site: Secondary | ICD-10-CM | POA: Diagnosis present

## 2021-07-04 DIAGNOSIS — K56609 Unspecified intestinal obstruction, unspecified as to partial versus complete obstruction: Secondary | ICD-10-CM | POA: Diagnosis not present

## 2021-07-04 DIAGNOSIS — E876 Hypokalemia: Secondary | ICD-10-CM | POA: Diagnosis present

## 2021-07-04 DIAGNOSIS — R1084 Generalized abdominal pain: Secondary | ICD-10-CM | POA: Diagnosis not present

## 2021-07-04 DIAGNOSIS — Z20822 Contact with and (suspected) exposure to covid-19: Secondary | ICD-10-CM | POA: Diagnosis present

## 2021-07-04 DIAGNOSIS — K6389 Other specified diseases of intestine: Secondary | ICD-10-CM | POA: Diagnosis not present

## 2021-07-04 DIAGNOSIS — N83202 Unspecified ovarian cyst, left side: Secondary | ICD-10-CM | POA: Diagnosis not present

## 2021-07-04 DIAGNOSIS — M21371 Foot drop, right foot: Secondary | ICD-10-CM | POA: Diagnosis present

## 2021-07-04 DIAGNOSIS — R0602 Shortness of breath: Secondary | ICD-10-CM

## 2021-07-04 DIAGNOSIS — K5669 Other partial intestinal obstruction: Secondary | ICD-10-CM | POA: Diagnosis not present

## 2021-07-04 DIAGNOSIS — Z79899 Other long term (current) drug therapy: Secondary | ICD-10-CM

## 2021-07-04 DIAGNOSIS — Z8261 Family history of arthritis: Secondary | ICD-10-CM

## 2021-07-04 DIAGNOSIS — D649 Anemia, unspecified: Secondary | ICD-10-CM | POA: Diagnosis present

## 2021-07-04 HISTORY — DX: Crohn's disease, unspecified, without complications: K50.90

## 2021-07-04 LAB — CBC WITH DIFFERENTIAL/PLATELET
Abs Immature Granulocytes: 0.03 10*3/uL (ref 0.00–0.07)
Basophils Absolute: 0 10*3/uL (ref 0.0–0.1)
Basophils Relative: 1 %
Eosinophils Absolute: 0.1 10*3/uL (ref 0.0–0.5)
Eosinophils Relative: 1 %
HCT: 40.2 % (ref 36.0–46.0)
Hemoglobin: 13.3 g/dL (ref 12.0–15.0)
Immature Granulocytes: 0 %
Lymphocytes Relative: 12 %
Lymphs Abs: 1 10*3/uL (ref 0.7–4.0)
MCH: 32.3 pg (ref 26.0–34.0)
MCHC: 33.1 g/dL (ref 30.0–36.0)
MCV: 97.6 fL (ref 80.0–100.0)
Monocytes Absolute: 0.5 10*3/uL (ref 0.1–1.0)
Monocytes Relative: 6 %
Neutro Abs: 6.5 10*3/uL (ref 1.7–7.7)
Neutrophils Relative %: 80 %
Platelets: 227 10*3/uL (ref 150–400)
RBC: 4.12 MIL/uL (ref 3.87–5.11)
RDW: 13.3 % (ref 11.5–15.5)
WBC: 8.1 10*3/uL (ref 4.0–10.5)
nRBC: 0 % (ref 0.0–0.2)

## 2021-07-04 LAB — URINALYSIS, ROUTINE W REFLEX MICROSCOPIC
Bilirubin Urine: NEGATIVE
Glucose, UA: NEGATIVE mg/dL
Hgb urine dipstick: NEGATIVE
Ketones, ur: NEGATIVE mg/dL
Leukocytes,Ua: NEGATIVE
Nitrite: NEGATIVE
Protein, ur: NEGATIVE mg/dL
Specific Gravity, Urine: 1.008 (ref 1.005–1.030)
pH: 7 (ref 5.0–8.0)

## 2021-07-04 LAB — COMPREHENSIVE METABOLIC PANEL
ALT: 12 U/L (ref 0–44)
AST: 16 U/L (ref 15–41)
Albumin: 4.2 g/dL (ref 3.5–5.0)
Alkaline Phosphatase: 60 U/L (ref 38–126)
Anion gap: 6 (ref 5–15)
BUN: 12 mg/dL (ref 8–23)
CO2: 21 mmol/L — ABNORMAL LOW (ref 22–32)
Calcium: 9.1 mg/dL (ref 8.9–10.3)
Chloride: 113 mmol/L — ABNORMAL HIGH (ref 98–111)
Creatinine, Ser: 0.35 mg/dL — ABNORMAL LOW (ref 0.44–1.00)
GFR, Estimated: >60 mL/min (ref >60)
Glucose, Bld: 87 mg/dL (ref 70–99)
Potassium: 3.6 mmol/L (ref 3.5–5.1)
Sodium: 140 mmol/L (ref 135–145)
Total Bilirubin: 0.8 mg/dL (ref 0.3–1.2)
Total Protein: 7.4 g/dL (ref 6.5–8.1)

## 2021-07-04 LAB — LIPASE, BLOOD: Lipase: 27 U/L (ref 11–51)

## 2021-07-04 LAB — RESP PANEL BY RT-PCR (FLU A&B, COVID) ARPGX2
Influenza A by PCR: NEGATIVE
Influenza B by PCR: NEGATIVE
SARS Coronavirus 2 by RT PCR: NEGATIVE

## 2021-07-04 LAB — MAGNESIUM: Magnesium: 2.1 mg/dL (ref 1.7–2.4)

## 2021-07-04 MED ORDER — POLYVINYL ALCOHOL 1.4 % OP SOLN: 1.0000 [drp] | OPHTHALMIC | Status: DC | PRN

## 2021-07-04 MED ORDER — FENTANYL CITRATE PF 50 MCG/ML IJ SOSY
50.0000 ug | PREFILLED_SYRINGE | Freq: Once | INTRAMUSCULAR | Status: AC
  Administered 2021-07-04: 50 ug via INTRAVENOUS
  Filled 2021-07-04: qty 1

## 2021-07-04 MED ORDER — POTASSIUM CHLORIDE IN NACL 20-0.9 MEQ/L-% IV SOLN
INTRAVENOUS | Status: DC
  Filled 2021-07-04 (×3): qty 1000

## 2021-07-04 MED ORDER — ENOXAPARIN SODIUM 40 MG/0.4ML IJ SOSY
40.0000 mg | PREFILLED_SYRINGE | INTRAMUSCULAR | Status: DC
  Administered 2021-07-04 – 2021-07-05 (×2): 40 mg via SUBCUTANEOUS
  Filled 2021-07-04 (×2): qty 0.4

## 2021-07-04 MED ORDER — ONDANSETRON HCL 4 MG/2ML IJ SOLN
4.0000 mg | Freq: Once | INTRAMUSCULAR | Status: AC
  Administered 2021-07-04: 4 mg via INTRAVENOUS
  Filled 2021-07-04: qty 2

## 2021-07-04 MED ORDER — SUMATRIPTAN SUCCINATE 6 MG/0.5ML ~~LOC~~ SOLN
3.0000 mg | Freq: Every day | SUBCUTANEOUS | Status: DC | PRN
  Administered 2021-07-05 (×2): 3 mg via SUBCUTANEOUS
  Filled 2021-07-04 (×4): qty 0.5

## 2021-07-04 MED ORDER — ONDANSETRON HCL 4 MG/2ML IJ SOLN
4.0000 mg | Freq: Four times a day (QID) | INTRAMUSCULAR | Status: DC | PRN
  Administered 2021-07-05: 4 mg via INTRAVENOUS
  Filled 2021-07-04: qty 2

## 2021-07-04 MED ORDER — ACETAMINOPHEN 650 MG RE SUPP: 650.0000 mg | Freq: Four times a day (QID) | RECTAL | Status: DC | PRN

## 2021-07-04 MED ORDER — PANTOPRAZOLE SODIUM 40 MG IV SOLR
40.0000 mg | INTRAVENOUS | Status: DC
  Administered 2021-07-04 – 2021-07-05 (×2): 40 mg via INTRAVENOUS
  Filled 2021-07-04 (×2): qty 40

## 2021-07-04 MED ORDER — POTASSIUM CHLORIDE IN NACL 40-0.9 MEQ/L-% IV SOLN
INTRAVENOUS | Status: AC
  Filled 2021-07-04: qty 1000

## 2021-07-04 MED ORDER — ONDANSETRON HCL 4 MG PO TABS: 4.0000 mg | ORAL_TABLET | Freq: Four times a day (QID) | ORAL | Status: DC | PRN

## 2021-07-04 MED ORDER — ACETAMINOPHEN 325 MG PO TABS: 650.0000 mg | ORAL_TABLET | Freq: Four times a day (QID) | ORAL | Status: DC | PRN

## 2021-07-04 MED ORDER — IOHEXOL 350 MG/ML SOLN
80.0000 mL | Freq: Once | INTRAVENOUS | Status: AC | PRN
  Administered 2021-07-04: 80 mL via INTRAVENOUS

## 2021-07-04 MED ORDER — HYDROMORPHONE HCL 1 MG/ML IJ SOLN
0.5000 mg | INTRAMUSCULAR | Status: DC | PRN
  Administered 2021-07-04 (×2): 0.5 mg via INTRAVENOUS
  Filled 2021-07-04 (×2): qty 1

## 2021-07-04 MED ORDER — SODIUM CHLORIDE 0.9 % IV BOLUS
1000.0000 mL | Freq: Once | INTRAVENOUS | Status: AC
  Administered 2021-07-04: 1000 mL via INTRAVENOUS

## 2021-07-04 NOTE — H&P (Signed)
History and Physical    [name] FKC:[id number] DOB: 1955/11/23 DOA: 07/04/2021  PCP: Isaac Bliss, Rayford Halsted, MD  Patient coming from: Home.  I have personally briefly reviewed patient's old medical records in [place]  Chief Complaint: Abdominal pain and nausea.  HPI: [name] is a 65 y.o. female with medical history significant of osteoarthritis, right foot drop, migraine headaches, history of pancreatitis, Crohn's disease with associated history of bowel resection, history of SBO episodes most recently 5 to 6 years ago who came into the emergency department with abdominal pain and nausea since early in the morning to pick although her SBO symptoms.  No emesis, diarrhea, constipation, melena or hematochezia.  She is stated that they usually resolve with analgesics, antiemetics, IVF and bowel rest.  She had lysis of her admissions in 1994.  Denied fever, chills, sore throat, rhinorrhea, wheezing, dyspnea or hemoptysis.  No chest pain, palpitations diaphoresis, PND, orthopnea or pitting edema of the lower extremities.  Denied flank pain, dysuria, frequency or hematuria.  No polyuria, polydipsia, polyphagia or blurred vision.  ED Course: Initial vital signs were temperature 97.8 F, pulse 88, respiration 18, BP 168/82 mmHg O2 sat 98% on room air.  The patient received 1000 mL NS bolus, fentanyl 50 mcg IVP and Zofran 4 mg IVP x1.  Lab work: Her urinalysis was unremarkable.  CBC was normal.  Normal lipase was 27 units/L.  CMP showed a chloride of 113 and CO2 of 21 mmol/L.  The rest of the CMP results were within expected range.  Imaging: CT abdomen/pelvis with contrast show previous right hemicolectomy with enterocolonic anastomosis.  There was increased caliber of the distal small bowel loops with surrounding soft tissue stranding and fluid in the right hemiabdomen.  The anastomosis appears patent but there is a focal area of luminal narrowing involving the proximal  transverse colon.  There was mild intrahepatic biliary and CBD dilatation.  Left ovary cyst.  Please see images and full radiology report for further details.  Review of Systems: As per HPI otherwise all other systems reviewed and are negative.  Past Medical History:  Diagnosis Date   Arthritis    Crohn disease ([place]) 07/04/2021   Crohn's disease ([place])    03-26-15 Prednisone sarted 2 weeks ago   Foot drop    right foot-wears brace.   Migraine    On prednisone therapy    started 2 weeks ago   Pancreatitis     Past Surgical History:  Procedure Laterality Date   BOWEL RESECTION     Crohn's disease   COLONOSCOPY WITH PROPOFOL N/A 04/02/2015   Procedure: COLONOSCOPY WITH PROPOFOL;  Surgeon: Garlan Fair, MD;  Location: [facility];  Service: Endoscopy;  Laterality: N/A;   hip replacement x 5      KNEE SURGERY     scopes   SHOULDER SURGERY     debridement    Social History  reports that she has never smoked. She has never used smokeless tobacco. She reports current alcohol use. She reports that she does not use drugs.  No Known Allergies  Family History  Problem Relation Age of Onset   Stroke Mother    Diabetes Mother    COPD Father    Arthritis Father    Prior to Admission medications   Medication Sig Start Date End Date Taking? Authorizing Provider  CIMZIA STARTER KIT 6 X 200 MG/ML KIT Inject into the skin. Patient not taking: No sig reported 06/20/20  [provider]  meclizine (ANTIVERT) 12.5 MG tablet Take 12.5 mg by mouth 2 (two) times daily as needed. 04/05/20   [provider]  oxyCODONE-acetaminophen (PERCOCET) 5-325 MG per tablet Take 1-2 tablets by mouth every 6 (six) hours as needed for severe pain. Patient taking differently: Take 1-2 tablets by mouth every 6 (six) hours as needed for severe pain. Dr Genia Hotter 03/10/15   Hess, Hessie Diener, PA-C  promethazine (PHENERGAN) 25 MG tablet Take 1 tablet by mouth every 4 (four) hours as needed.  Nausea/migraines    [provider]  SUMAtriptan (IMITREX) 6 MG/0.5ML SOLN injection Inject 6 mLs into the skin as needed. migraines    [provider]  topiramate (TOPAMAX) 200 MG tablet Take 300 mg by mouth daily. 06/25/21   [provider]  topiramate (TOPAMAX) 25 MG tablet Take 25 mg by mouth at bedtime.     [provider]    Physical Exam: Vitals:   07/04/21 0946 07/04/21 1100 07/04/21 1130 07/04/21 1230  BP:  130/73 132/68 123/66  Pulse:  69 65 74  Resp:  _0 Temp:      TempSrc:      SpO2:  100% 100% 98%  Weight: 63.5 kg     Height: _1  (1.549 m)       Constitutional: NAD, calm, comfortable Eyes: PERRL, lids and conjunctivae normal ENMT: Mucous membranes are dry.  Posterior pharynx clear of any exudate or lesions. Neck: normal, supple, no masses, no thyromegaly Respiratory: clear to auscultation bilaterally, no wheezing, no crackles. Normal respiratory effort. No accessory muscle use.  Cardiovascular: Regular rate and rhythm, no murmurs / rubs / gallops. No extremity edema. 2+ pedal pulses. No carotid bruits.  Abdomen: No distention.  Midline surgical scar.  Bowel sounds positive. Soft, positive RUQ tenderness, no guarding or rebound, no masses palpated. No hepatosplenomegaly.  Musculoskeletal: no clubbing / cyanosis.  Good ROM, no contractures. Normal muscle tone.  Skin: no acute rashes, lesions, ulcers on limited dermatological examination. Neurologic: CN 2-12 grossly intact. Sensation intact, DTR normal. Strength 5/5 in all 4.  Psychiatric: Normal judgment and insight. Alert and oriented x 3. Normal mood.   Labs on Admission: I have personally reviewed following labs and imaging studies  CBC: Recent Labs  Lab 07/04/21 0955  WBC 8.1  NEUTROABS 6.5  HGB 13.3  HCT 40.2  MCV 97.6  PLT 462   Basic Metabolic Panel: Recent Labs  Lab 07/04/21 0955  NA 140  K 3.6  CL 113*  CO2 21*  GLUCOSE 87  BUN 12  CREATININE 0.35*   CALCIUM 9.1   GFR: Estimated Creatinine Clearance: 59.9 mL/min (A) (by C-G formula based on SCr of 0.35 mg/dL (L)).  Liver Function Tests: Recent Labs  Lab 07/04/21 0955  AST 16  ALT 12  ALKPHOS 60  BILITOT 0.8  PROT 7.4  ALBUMIN 4.2   Urine analysis:    Component Value Date/Time   COLORURINE STRAW (A) 07/04/2021 1225   APPEARANCEUR CLEAR 07/04/2021 1225   LABSPEC 1.008 07/04/2021 1225   PHURINE 7.0 07/04/2021 1225   GLUCOSEU NEGATIVE 07/04/2021 1225   HGBUR NEGATIVE 07/04/2021 [place] 07/04/2021 1225        KETONESUR NEGATIVE 07/04/2021 1225   PROTEINUR NEGATIVE 07/04/2021 1225   UROBILINOGEN 0.2 02/20/2021 1604   UROBILINOGEN 0.2 03/10/2015 0845   NITRITE NEGATIVE 07/04/2021 1225   LEUKOCYTESUR NEGATIVE 07/04/2021 1225   Radiological Exams on Admission: CT ABDOMEN PELVIS  W CONTRAST  Result Date: 07/04/2021 CLINICAL DATA:  Evaluate for bowel obstruction. EXAM: CT ABDOMEN AND PELVIS WITH CONTRAST TECHNIQUE: Multidetector CT imaging of the abdomen and pelvis was performed using the standard protocol following bolus administration of intravenous contrast. CONTRAST:  64m OMNIPAQUE IOHEXOL 350 MG/ML SOLN COMPARISON:  03/10/2015 FINDINGS: Lower chest: No acute abnormality. Hepatobiliary: No focal liver abnormality. Gallbladder appears normal. Mild intrahepatic biliary dilatation. The common bile duct measures 7 mm in maximum diameter. No obstructing mass or signs of choledocholithiasis by CT. Pancreas: Unremarkable. No pancreatic ductal dilatation or surrounding inflammatory changes. Spleen: Normal in size without focal abnormality. Adrenals/Urinary Tract: Normal adrenal glands. Exophytic cyst arising off inferior pole of the left kidney measures 1.1 cm, image 36/2. No hydronephrosis. Evaluation of the bladder in surrounding pelvic organs is significantly diminished due to streak artifact from bilateral hip arthroplasty devices. Stomach/Bowel: Stomach is normal.  Proximal small bowel loops are unremarkable. Signs of previous right hemicolectomy with enterocolonic anastomosis. Increase caliber of the distal small bowel loops with surrounding soft tissue stranding and fluid is noted in the right hemiabdomen. The dilated small bowel loops measure 3.3 cm in diameter up to the level of the enterocolonic anastomosis, image 41/5. The enterocolonic anastomosis appears patent. However, there is wall thickening involving the proximal colon at the level of the anastomosis. Additionally, just beyond the anastomosis is a focal area of luminal narrowing involving the proximal transverse colon, image 36/5 and image 38/2. This is concerning for post anastomotic stricture. The remaining portions of the colon are unremarkable. Assessment of the sigmoid colon and rectum is markedly diminished due to beam hardening artifact from patient's bilateral hip prostheses. Vascular/Lymphatic: No significant vascular findings are present. No enlarged abdominal or pelvic lymph nodes. Reproductive: Evaluation of the reproductive organs is markedly diminished due to beam hardening artifact from bilateral hip prostheses. Simple appearing cyst within the left ovary measures 2.2, image 59/2. Other: Mild fluid is noted within the right abdomen at the level of the enterocolonic anastomosis. Musculoskeletal: Status post bilateral hip arthroplasty. IMPRESSION: 1. Signs of previous right hemicolectomy with enterocolonic anastomosis. Increase caliber of the distal small bowel loops with surrounding soft tissue stranding and fluid is noted in the right hemiabdomen. Although the anastomosis appears patent there is wall thickening involving the proximal colon at the level of the anastomosis. Additionally, just beyond the anastomosis is a focal area of luminal narrowing involving the proximal transverse colon. This is concerning for post anastomotic stricture resulting in distal small bowel obstruction. 2. Mild  intrahepatic biliary dilatation and common bile duct dilatation. No obstructing mass or signs of choledocholithiasis by CT. 3. Left ovary cyst measures 2.2 cm.  No follow-up imaging indicated. Electronically Signed   By: TKerby MoorsM.D.   On: 07/04/2021 12:17    EKG: Independently reviewed.  Assessment/Plan Principal Problem:   SBO (small bowel obstruction) (HCC) Observation/telemetry. Keep NPO.  Continue IV fluids. Antiemetics as needed. Analgesics as needed. Pantoprazole 40 mg IVP daily. Follow-up CBC, BMP and magnesium. Surgical consult appreciated.  Active Problems:   Migraine Hold Topamax while NPO.   SQ sumatriptan as needed.    [place] No obvious disease activity. Follow-up with GI.   DVT prophylaxis: Lovenox SQ. Code Status:   Full code. Family Communication:   Disposition Plan:   Patient is from:  Home.  Anticipated DC to:  Home.  Anticipated DC date:  07/04/2021.  Anticipated DC barriers: Clinical status.  Consults called:  Dr. DCoralie Keens(general surgery).  Admission status:  Observation/telemetry.   Severity of Illness: High severity after presenting with abdominal pain with nausea typical of her small bowel obstruction episodes.  The patient will remain in the hospital at least overnight for symptomatic treatment, IV hydration and bowel rest.   Reubin Milan MD [facility]  How to contact the [facility] Attending or Consulting provider [place] or covering provider during after hours [place], for this patient?   Check the care team in [facility] and look for a) attending/consulting [facility] provider listed and b) the [facility] team listed Log into [email or web address] and use [place]'s universal password to access. If you do not have the password, please contact the hospital operator. Locate the [facility] provider you are looking for under [facility] and page to a number that you can be directly reached. If you still have difficulty reaching the  provider, please page the [facility] (Director on Call) for the Hospitalists listed on amion for assistance.  07/04/2021, 1:50 PM   This document was prepared using Dragon voice recognition software and may contain some unintended transcription errors.

## 2021-07-04 NOTE — ED Triage Notes (Signed)
Reports she thinks her intestine is blocked, states it feels like it has in the past, abd cramps that pains her whole stomach. Reports nausea. Has had small intestine sx 20 years ago.

## 2021-07-04 NOTE — ED Provider Notes (Signed)
[facility] DEPT Provider Note   CSN: [id number] Arrival date & time: 07/04/21  0931     History Chief Complaint  Patient presents with   Abdominal Pain    [name] is a 65 y.o. female.  The history is provided by the patient.  Abdominal Pain Pain location:  Generalized Pain quality: aching   Pain radiates to:  Does not radiate Pain severity:  Mild Onset quality:  Sudden Duration:  4 hours Timing:  Constant Progression:  Unchanged Chronicity:  Recurrent Context: previous surgery   Relieved by:  Nothing Worsened by:  Nothing Associated symptoms: nausea   Associated symptoms: no chest pain, no chills, no constipation, no cough, no diarrhea, no dysuria, no fever, no hematuria, no shortness of breath, no sore throat and no vomiting   Risk factors: multiple surgeries       Past Medical History:  Diagnosis Date   Arthritis    Crohn's disease ([place])    03-26-15 Prednisone sarted 2 weeks ago   Foot drop    right foot-wears brace.   Migraine    On prednisone therapy    started 2 weeks ago   Pancreatitis     Patient Active Problem List   Diagnosis Date Noted   Vitamin D deficiency 06/27/2020   Migraine    Arthritis    Foot drop     Past Surgical History:  Procedure Laterality Date   BOWEL RESECTION     Crohn's disease   COLONOSCOPY WITH PROPOFOL N/A 04/02/2015   Procedure: COLONOSCOPY WITH PROPOFOL;  Surgeon: Garlan Fair, MD;  Location: [facility];  Service: Endoscopy;  Laterality: N/A;   hip replacement x 5      KNEE SURGERY     scopes   SHOULDER SURGERY     debridement     OB History   No obstetric history on file.     Family History  Problem Relation Age of Onset   Stroke Mother    Diabetes Mother    COPD Father    Arthritis Father     Social History   Tobacco Use   Smoking status: Never   Smokeless tobacco: Never  Substance Use Topics   Alcohol use: Yes    Comment: social    Drug use: No     Home Medications Prior to Admission medications   Medication Sig Start Date End Date Taking? Authorizing Provider  CIMZIA STARTER KIT 6 X 200 MG/ML KIT Inject into the skin. Patient not taking: No sig reported 06/20/20   [provider]  meclizine (ANTIVERT) 12.5 MG tablet Take 12.5 mg by mouth 2 (two) times daily as needed. 04/05/20   [provider]  oxyCODONE-acetaminophen (PERCOCET) 5-325 MG per tablet Take 1-2 tablets by mouth every 6 (six) hours as needed for severe pain. Patient taking differently: Take 1-2 tablets by mouth every 6 (six) hours as needed for severe pain. Dr Genia Hotter 03/10/15   Hess, Hessie Diener, PA-C  promethazine (PHENERGAN) 25 MG tablet Take 1 tablet by mouth every 4 (four) hours as needed. Nausea/migraines    [provider]  SUMAtriptan (IMITREX) 6 MG/0.5ML SOLN injection Inject 6 mLs into the skin as needed. migraines    [provider]  topiramate (TOPAMAX) 25 MG tablet Take 25 mg by mouth at bedtime.     [provider]    Allergies    Patient has no known allergies.  Review of Systems   Review of Systems  Constitutional:  Negative for chills and fever.  HENT:  Negative for ear pain and sore throat.   Eyes:  Negative for pain and visual disturbance.  Respiratory:  Negative for cough and shortness of breath.   Cardiovascular:  Negative for chest pain and palpitations.  Gastrointestinal:  Positive for abdominal pain and nausea. Negative for constipation, diarrhea and vomiting.  Genitourinary:  Negative for dysuria and hematuria.  Musculoskeletal:  Negative for arthralgias and back pain.  Skin:  Negative for color change and rash.  Neurological:  Negative for seizures and syncope.  All other systems reviewed and are negative.  Physical Exam Updated Vital Signs BP 123/66   Pulse 74   Temp 97.8 F (36.6 C) (Oral)   Resp 18   Ht 5' 1"  (1.549 m)   Wt 63.5 kg   SpO2 98%   BMI 26.45 kg/m   Physical Exam Vitals  and nursing note reviewed.  Constitutional:      General: She is not in acute distress.    Appearance: She is well-developed. She is not ill-appearing.  HENT:     Head: Normocephalic and atraumatic.     Mouth/Throat:     Mouth: Mucous membranes are moist.  Eyes:     Extraocular Movements: Extraocular movements intact.     Conjunctiva/sclera: Conjunctivae normal.  Cardiovascular:     Rate and Rhythm: Normal rate and regular rhythm.     Heart sounds: Normal heart sounds. No murmur heard. Pulmonary:     Effort: Pulmonary effort is normal. No respiratory distress.     Breath sounds: Normal breath sounds.  Abdominal:     Palpations: Abdomen is soft.     Tenderness: There is generalized abdominal tenderness.  Musculoskeletal:     Cervical back: Neck supple.  Skin:    General: Skin is warm and dry.  Neurological:     Mental Status: She is alert.    ED Results / Procedures / Treatments   Labs (all labs ordered are listed, but only abnormal results are displayed) Labs Reviewed  COMPREHENSIVE METABOLIC PANEL - Abnormal; Notable for the following components:      Result Value   Chloride 113 (*)    CO2 21 (*)    Creatinine, Ser 0.35 (*)    All other components within normal limits  URINALYSIS, ROUTINE W REFLEX MICROSCOPIC - Abnormal; Notable for the following components:   Color, Urine STRAW (*)    All other components within normal limits  RESP PANEL BY RT-PCR (FLU A&B, COVID) ARPGX2  CBC WITH DIFFERENTIAL/PLATELET  LIPASE, BLOOD    EKG None  Radiology CT ABDOMEN PELVIS W CONTRAST  Result Date: 07/04/2021 CLINICAL DATA:  Evaluate for bowel obstruction. EXAM: CT ABDOMEN AND PELVIS WITH CONTRAST TECHNIQUE: Multidetector CT imaging of the abdomen and pelvis was performed using the standard protocol following bolus administration of intravenous contrast. CONTRAST:  73m OMNIPAQUE IOHEXOL 350 MG/ML SOLN COMPARISON:  03/10/2015 FINDINGS: Lower chest: No acute abnormality.  Hepatobiliary: No focal liver abnormality. Gallbladder appears normal. Mild intrahepatic biliary dilatation. The common bile duct measures 7 mm in maximum diameter. No obstructing mass or signs of choledocholithiasis by CT. Pancreas: Unremarkable. No pancreatic ductal dilatation or surrounding inflammatory changes. Spleen: Normal in size without focal abnormality. Adrenals/Urinary Tract: Normal adrenal glands. Exophytic cyst arising off inferior pole of the left kidney measures 1.1 cm, image 36/2. No hydronephrosis. Evaluation of the bladder in surrounding pelvic organs is significantly diminished due to streak artifact from bilateral hip arthroplasty devices. Stomach/Bowel:  Stomach is normal. Proximal small bowel loops are unremarkable. Signs of previous right hemicolectomy with enterocolonic anastomosis. Increase caliber of the distal small bowel loops with surrounding soft tissue stranding and fluid is noted in the right hemiabdomen. The dilated small bowel loops measure 3.3 cm in diameter up to the level of the enterocolonic anastomosis, image 41/5. The enterocolonic anastomosis appears patent. However, there is wall thickening involving the proximal colon at the level of the anastomosis. Additionally, just beyond the anastomosis is a focal area of luminal narrowing involving the proximal transverse colon, image 36/5 and image 38/2. This is concerning for post anastomotic stricture. The remaining portions of the colon are unremarkable. Assessment of the sigmoid colon and rectum is markedly diminished due to beam hardening artifact from patient's bilateral hip prostheses. Vascular/Lymphatic: No significant vascular findings are present. No enlarged abdominal or pelvic lymph nodes. Reproductive: Evaluation of the reproductive organs is markedly diminished due to beam hardening artifact from bilateral hip prostheses. Simple appearing cyst within the left ovary measures 2.2, image 59/2. Other: Mild fluid is noted  within the right abdomen at the level of the enterocolonic anastomosis. Musculoskeletal: Status post bilateral hip arthroplasty. IMPRESSION: 1. Signs of previous right hemicolectomy with enterocolonic anastomosis. Increase caliber of the distal small bowel loops with surrounding soft tissue stranding and fluid is noted in the right hemiabdomen. Although the anastomosis appears patent there is wall thickening involving the proximal colon at the level of the anastomosis. Additionally, just beyond the anastomosis is a focal area of luminal narrowing involving the proximal transverse colon. This is concerning for post anastomotic stricture resulting in distal small bowel obstruction. 2. Mild intrahepatic biliary dilatation and common bile duct dilatation. No obstructing mass or signs of choledocholithiasis by CT. 3. Left ovary cyst measures 2.2 cm.  No follow-up imaging indicated. Electronically Signed   By: Kerby Moors M.D.   On: 07/04/2021 12:17    Procedures Procedures   Medications Ordered in ED Medications  fentaNYL (SUBLIMAZE) injection 50 mcg (50 mcg Intravenous Given 07/04/21 1043)  ondansetron (ZOFRAN) injection 4 mg (4 mg Intravenous Given 07/04/21 1042)  sodium chloride 0.9 % bolus 1,000 mL (0 mLs Intravenous Stopped 07/04/21 1221)  iohexol (OMNIPAQUE) 350 MG/ML injection 80 mL (80 mLs Intravenous Contrast Given 07/04/21 1152)    ED Course  I have reviewed the triage vital signs and the nursing notes.  Pertinent labs & imaging results that were available during my care of the patient were reviewed by me and considered in my medical decision making (see chart for details).    MDM Rules/Calculators/A&P                           Ennis Forts is here with abdominal pain.  History of multiple surgeries/Crohn's in the past.  Not on any treatment for Crohn's at this time.  Feels like she is having a bowel obstruction.  Pain came on suddenly on the last several hours.  Nauseous but no  vomiting.  Has not passed gas.  Diffuse abdominal tenderness on exam.  Otherwise well-appearing.  Concern for bowel obstruction versus less likely appendicitis or cholecystitis.  We will get basic labs and CT scan of the abdomen pelvis.  Will give IV fluids, IV Zofran, IV fentanyl.  CT scan shows signs concerning for post anastomotic stricture resulting in a distal small bowel obstruction.  Lab work otherwise unremarkable.  General surgery to evaluate the patient and follow along and  hospitalist to admit for further care.  Currently she is not passing gas but her nausea, vomiting, pain is under control.  We will hold off on NG tube at this time.  She states that typically this resolves without NG tube.  Admitted in stable condition.  This chart was dictated using voice recognition software.  Despite best efforts to proofread,  errors can occur which can change the documentation meaning.   Final Clinical Impression(s) / ED Diagnoses Final diagnoses:  SBO (small bowel obstruction) [facility])    Rx / DC Orders ED Discharge Orders     None        Lennice Sites, DO 07/04/21 1314

## 2021-07-04 NOTE — Consult Note (Addendum)
Consult Note  [name] 06/13/1956  [id number].    Requesting MD: Dr. Ronnald Nian Chief Complaint/Reason for Consult: abdominal pain  HPI:  65 year old female with history of Chron's ileitis s/p right hemicolectomy with anastomosis in 1994, arthritis, migraine, pancreatitis who presented to [facility] ED due to acute onset abdominal pain and nausea that began this am. No emesis. She states she has had episodes similar to this in the past - most recently 5-6 years ago. Episodes usually resolve with pain medications and bowel rest. Soon following surgery in 1994 she did require what sounds like lysis of adhesions and NGT placement for obstruction but no procedural intervention seen. Last colonoscopy in 2016 which showed normal ileo-right colonic surgical anastomosis. She says she is not due for her next colonoscopy in 2026.  Work up in ED significant for CT scan showing Signs of previous right hemicolectomy with enterocolonic anastomosis. Increase caliber of the distal small bowel loops with surrounding soft tissue stranding and fluid is noted in the right hemiabdomen. Although the anastomosis appears patent there is wall thickening involving the proximal colon at the level of the anastomosis. Additionally, just beyond the anastomosis is a focal area of luminal narrowing involving the proximal transverse colon. This is concerning for post anastomotic stricture resulting in distal small bowel obstruction.  Since arrival to ED nausea has resolved and pain is stable to mildly improved and focal to right lower quadrant. She is passing flatus. She states she feels like episode is resolving  Substance use: none Allergies: none Blood thinners: none  ROS: Review of Systems  Constitutional:  Negative for chills and fever.  Respiratory:  Negative for cough, shortness of breath and wheezing.   Cardiovascular:  Negative for chest pain, palpitations and leg swelling.  Gastrointestinal:  Positive for  abdominal pain and nausea. Negative for constipation and vomiting.  Genitourinary: Negative.    Family History  Problem Relation Age of Onset   Stroke Mother    Diabetes Mother    COPD Father    Arthritis Father     Past Medical History:  Diagnosis Date   Arthritis    Crohn disease ([place]) 07/04/2021   Crohn's disease ([place])    03-26-15 Prednisone sarted 2 weeks ago   Foot drop    right foot-wears brace.   Migraine    On prednisone therapy    started 2 weeks ago   Pancreatitis     Past Surgical History:  Procedure Laterality Date   BOWEL RESECTION     Crohn's disease   COLONOSCOPY WITH PROPOFOL N/A 04/02/2015   Procedure: COLONOSCOPY WITH PROPOFOL;  Surgeon: Garlan Fair, MD;  Location: [facility] END[facility]COPY;  Service: Endoscopy;  Laterality: N/A;   hip replacement x 5      KNEE SURGERY     scopes   SHOULDER SURGERY     debridement    Social History:  reports that she has never smoked. She has never used smokeless tobacco. She reports current alcohol use. She reports that she does not use drugs.  Allergies: No Known Allergies  (Not in a hospital admission)   Blood pressure 123/66, pulse 74, temperature 97.8 F (36.6 C), temperature source Oral, resp. rate 18, height 5\' 1"  (1.549 m), weight 63.5 kg, SpO2 98 %. Physical Exam:  General: pleasant, WD, female who is laying in bed in NAD HEENT: head is normocephalic, atraumatic.  Sclera are noninjected.  EOMs intact. Pupils equal and round.  Ears and nose  without any masses or lesions.  Mouth is pink and moist Heart: regular, rate, and rhythm.  Normal s1,s2. No obvious murmurs, gallops, or rubs noted.  Palpable radial and pedal pulses bilaterally Lungs: CTAB, no wheezes, rhonchi, or rales noted.  Respiratory effort nonlabored Abd: soft, ND, +BS, no masses, hernias, or organomegaly. Mild focal TTP in right lower quadrant without rebound or guarding. Well healed midline surgical scar and left upper quadrant scar from drain MS: all 4  extremities are symmetrical with no cyanosis, clubbing, or edema. Skin: warm and dry with no masses, lesions, or rashes Neuro: Cranial nerves 2-12 grossly intact, sensation is normal throughout Psych: A&Ox3 with an appropriate affect.   Results for orders placed or performed during the hospital encounter of 07/04/21 (from the past 48 hour(s))  CBC with Differential     Status: None   Collection Time: 07/04/21  9:55 AM  Result Value Ref Range   WBC 8.1 4.0 - 10.5 K/uL   RBC 4.12 3.87 - 5.11 MIL/uL   Hemoglobin 13.3 12.0 - 15.0 g/dL   HCT 40.2 36.0 - 46.0 %   MCV 97.6 80.0 - 100.0 fL   MCH 32.3 26.0 - 34.0 pg   MCHC 33.1 30.0 - 36.0 g/dL   RDW 13.3 11.5 - 15.5 %   Platelets 227 150 - 400 K/uL   nRBC 0.0 0.0 - 0.2 %   Neutrophils Relative % 80 %   Neutro Abs 6.5 1.7 - 7.7 K/uL   Lymphocytes Relative 12 %   Lymphs Abs 1.0 0.7 - 4.0 K/uL   Monocytes Relative 6 %   Monocytes Absolute 0.5 0.1 - 1.0 K/uL   Eosinophils Relative 1 %   Eosinophils Absolute 0.1 0.0 - 0.5 K/uL   Basophils Relative 1 %   Basophils Absolute 0.0 0.0 - 0.1 K/uL   Immature Granulocytes 0 %   Abs Immature Granulocytes 0.03 0.00 - 0.07 K/uL    Comment: Performed at [facility], [place] [address] [place], [place] [address]  Comprehensive metabolic panel     Status: Abnormal   Collection Time: 07/04/21  9:55 AM  Result Value Ref Range   Sodium 140 135 - 145 mmol/L   Potassium 3.6 3.5 - 5.1 mmol/L   Chloride 113 (H) 98 - 111 mmol/L   CO2 21 (L) 22 - 32 mmol/L   Glucose, Bld 87 70 - 99 mg/dL    Comment: Glucose reference range applies only to samples taken after fasting for at least 8 hours.   BUN 12 8 - 23 mg/dL   Creatinine, Ser 0.35 (L) 0.44 - 1.00 mg/dL   Calcium 9.1 8.9 - 10.3 mg/dL   Total Protein 7.4 6.5 - 8.1 g/dL   Albumin 4.2 3.5 - 5.0 g/dL   AST 16 15 - 41 U/L   ALT 12 0 - 44 U/L   Alkaline Phosphatase 60 38 - 126 U/L   Total Bilirubin 0.8 0.3 - 1.2 mg/dL   GFR, Estimated >60  >60 mL/min    Comment: (NOTE) Calculated using the CKD-EPI Creatinine Equation (2021)    Anion gap 6 5 - 15    Comment: Performed at [facility], [place] [address] [place], [place] [address]  Lipase, blood     Status: None   Collection Time: 07/04/21  9:55 AM  Result Value Ref Range   Lipase 27 11 - 51 U/L    Comment: Performed at [facility], [place] [address] [place],  [place] [address]  Urinalysis, Routine w reflex microscopic     Status: Abnormal   Collection Time: 07/04/21 12:25 PM  Result Value Ref Range   Color, Urine STRAW (A) YELLOW   APPearance CLEAR CLEAR   Specific Gravity, Urine 1.008 1.005 - 1.030   pH 7.0 5.0 - 8.0   Glucose, UA NEGATIVE NEGATIVE mg/dL   Hgb urine dipstick NEGATIVE NEGATIVE   Bilirubin Urine NEGATIVE NEGATIVE   Ketones, ur NEGATIVE NEGATIVE mg/dL   Protein, ur NEGATIVE NEGATIVE mg/dL   Nitrite NEGATIVE NEGATIVE   Leukocytes,Ua NEGATIVE NEGATIVE    Comment: Performed at [place] [address] [place], [place] [address]  Resp Panel by RT-PCR (Flu A&B, Covid) Nasopharyngeal Swab     Status: None   Collection Time: 07/04/21 12:38 PM   Specimen: Nasopharyngeal Swab; Nasopharyngeal(NP) swabs in vial transport medium  Result Value Ref Range   SARS Coronavirus 2 by RT PCR NEGATIVE NEGATIVE    Comment: (NOTE) SARS-CoV-2 target nucleic acids are NOT DETECTED.  The SARS-CoV-2 RNA is generally detectable in upper respiratory specimens during the acute phase of infection. The lowest concentration of SARS-CoV-2 viral copies this assay can detect is 138 copies/mL. A negative result does not preclude SARS-Cov-2 infection and should not be used as the sole basis for treatment or other patient management decisions. A negative result may occur with  improper specimen collection/handling, submission of specimen other than nasopharyngeal swab, presence of viral mutation(s) within the areas targeted by  this assay, and inadequate number of viral copies(<138 copies/mL). A negative result must be combined with clinical observations, patient history, and epidemiological information. The expected result is Negative.  Fact Sheet for Patients:  EntrepreneurPulse.com.au  Fact Sheet for Healthcare Providers:  IncredibleEmployment.be  This test is no t yet approved or cleared by the [place] FDA and  has been authorized for detection and/or diagnosis of SARS-CoV-2 by FDA under an Emergency Use Authorization (EUA). This EUA will remain  in effect (meaning this test can be used) for the duration of the COVID-19 declaration under Section 564(b)(1) of the Act, 21 U.S.C.section 360bbb-3(b)(1), unless the authorization is terminated  or revoked sooner.       Influenza A by PCR NEGATIVE NEGATIVE   Influenza B by PCR NEGATIVE NEGATIVE    Comment: (NOTE) The Xpert Xpress SARS-CoV-2/FLU/RSV plus assay is intended as an aid in the diagnosis of influenza from Nasopharyngeal swab specimens and should not be used as a sole basis for treatment. Nasal washings and aspirates are unacceptable for Xpert Xpress SARS-CoV-2/FLU/RSV testing.  Fact Sheet for Patients: EntrepreneurPulse.com.au  Fact Sheet for Healthcare Providers: IncredibleEmployment.be  This test is not yet approved or cleared by the [place] FDA and has been authorized for detection and/or diagnosis of SARS-CoV-2 by FDA under an Emergency Use Authorization (EUA). This EUA will remain in effect (meaning this test can be used) for the duration of the COVID-19 declaration under Section 564(b)(1) of the Act, 21 U.S.C. section 360bbb-3(b)(1), unless the authorization is terminated or revoked.  Performed at [facility], [place] [address], [place],  [address]    CT ABDOMEN PELVIS W CONTRAST  Result Date: 07/04/2021 CLINICAL DATA:   Evaluate for bowel obstruction. EXAM: CT ABDOMEN AND PELVIS WITH CONTRAST TECHNIQUE: Multidetector CT imaging of the abdomen and pelvis was performed using the standard protocol following bolus administration of intravenous contrast. CONTRAST:  78mL OMNIPAQUE IOHEXOL 350 MG/ML SOLN COMPARISON:  03/10/2015 FINDINGS: Lower chest: No acute abnormality.  Hepatobiliary: No focal liver abnormality. Gallbladder appears normal. Mild intrahepatic biliary dilatation. The common bile duct measures 7 mm in maximum diameter. No obstructing mass or signs of choledocholithiasis by CT. Pancreas: Unremarkable. No pancreatic ductal dilatation or surrounding inflammatory changes. Spleen: Normal in size without focal abnormality. Adrenals/Urinary Tract: Normal adrenal glands. Exophytic cyst arising off inferior pole of the left kidney measures 1.1 cm, image 36/2. No hydronephrosis. Evaluation of the bladder in surrounding pelvic organs is significantly diminished due to streak artifact from bilateral hip arthroplasty devices. Stomach/Bowel: Stomach is normal. Proximal small bowel loops are unremarkable. Signs of previous right hemicolectomy with enterocolonic anastomosis. Increase caliber of the distal small bowel loops with surrounding soft tissue stranding and fluid is noted in the right hemiabdomen. The dilated small bowel loops measure 3.3 cm in diameter up to the level of the enterocolonic anastomosis, image 41/5. The enterocolonic anastomosis appears patent. However, there is wall thickening involving the proximal colon at the level of the anastomosis. Additionally, just beyond the anastomosis is a focal area of luminal narrowing involving the proximal transverse colon, image 36/5 and image 38/2. This is concerning for post anastomotic stricture. The remaining portions of the colon are unremarkable. Assessment of the sigmoid colon and rectum is markedly diminished due to beam hardening artifact from patient's bilateral hip  prostheses. Vascular/Lymphatic: No significant vascular findings are present. No enlarged abdominal or pelvic lymph nodes. Reproductive: Evaluation of the reproductive organs is markedly diminished due to beam hardening artifact from bilateral hip prostheses. Simple appearing cyst within the left ovary measures 2.2, image 59/2. Other: Mild fluid is noted within the right abdomen at the level of the enterocolonic anastomosis. Musculoskeletal: Status post bilateral hip arthroplasty. IMPRESSION: 1. Signs of previous right hemicolectomy with enterocolonic anastomosis. Increase caliber of the distal small bowel loops with surrounding soft tissue stranding and fluid is noted in the right hemiabdomen. Although the anastomosis appears patent there is wall thickening involving the proximal colon at the level of the anastomosis. Additionally, just beyond the anastomosis is a focal area of luminal narrowing involving the proximal transverse colon. This is concerning for post anastomotic stricture resulting in distal small bowel obstruction. 2. Mild intrahepatic biliary dilatation and common bile duct dilatation. No obstructing mass or signs of choledocholithiasis by CT. 3. Left ovary cyst measures 2.2 cm.  No follow-up imaging indicated. Electronically Signed   By: Kerby Moors M.D.   On: 07/04/2021 12:17      Assessment/Plan SBO due to anastomotic stricture H/o Chron's ileitis s/p right hemicolectomy with anastomosis in 1994 - CT scan with anastomosis patent but wall thickening involving the proximal colon at the level of the anastomosis with luminal narrowing just beyond it and concern for distal SBO - pain is stable to very mildly improved. No nausea currently - she is very eager to discharge from ED and feels that her obstruction is resolving. We discussed concerning signs and symptoms of worsening obstruction and even perforation to which she stated understanding. Recommend admit to observation - if patient  refuses admission would recommend she continue to follow up with GI for future colonoscopy and with one of our colorectal surgeons for discussion of anastomotic revision  FEN: clears ID: none currently VTE: none currently, okay for chemical prophylaxis from surgical standpoint  Winferd Humphrey, [facility] 07/04/2021, 1:50 PM Please see Amion for pager number during day hours 7:00am-4:30pm

## 2021-07-05 DIAGNOSIS — G43709 Chronic migraine without aura, not intractable, without status migrainosus: Secondary | ICD-10-CM

## 2021-07-05 DIAGNOSIS — K50012 Crohn's disease of small intestine with intestinal obstruction: Secondary | ICD-10-CM | POA: Diagnosis not present

## 2021-07-05 DIAGNOSIS — G43909 Migraine, unspecified, not intractable, without status migrainosus: Secondary | ICD-10-CM | POA: Diagnosis not present

## 2021-07-05 DIAGNOSIS — E876 Hypokalemia: Secondary | ICD-10-CM | POA: Diagnosis not present

## 2021-07-05 DIAGNOSIS — Z825 Family history of asthma and other chronic lower respiratory diseases: Secondary | ICD-10-CM | POA: Diagnosis not present

## 2021-07-05 DIAGNOSIS — Z79899 Other long term (current) drug therapy: Secondary | ICD-10-CM | POA: Diagnosis not present

## 2021-07-05 DIAGNOSIS — K56609 Unspecified intestinal obstruction, unspecified as to partial versus complete obstruction: Secondary | ICD-10-CM | POA: Diagnosis not present

## 2021-07-05 DIAGNOSIS — K913 Postprocedural intestinal obstruction, unspecified as to partial versus complete: Secondary | ICD-10-CM | POA: Diagnosis not present

## 2021-07-05 DIAGNOSIS — R1084 Generalized abdominal pain: Secondary | ICD-10-CM | POA: Diagnosis not present

## 2021-07-05 DIAGNOSIS — Z833 Family history of diabetes mellitus: Secondary | ICD-10-CM | POA: Diagnosis not present

## 2021-07-05 DIAGNOSIS — D649 Anemia, unspecified: Secondary | ICD-10-CM | POA: Diagnosis not present

## 2021-07-05 DIAGNOSIS — M21371 Foot drop, right foot: Secondary | ICD-10-CM | POA: Diagnosis not present

## 2021-07-05 DIAGNOSIS — M1990 Unspecified osteoarthritis, unspecified site: Secondary | ICD-10-CM | POA: Diagnosis not present

## 2021-07-05 DIAGNOSIS — Z8261 Family history of arthritis: Secondary | ICD-10-CM | POA: Diagnosis not present

## 2021-07-05 DIAGNOSIS — E872 Acidosis, unspecified: Secondary | ICD-10-CM | POA: Diagnosis not present

## 2021-07-05 DIAGNOSIS — Z823 Family history of stroke: Secondary | ICD-10-CM | POA: Diagnosis not present

## 2021-07-05 DIAGNOSIS — Z20822 Contact with and (suspected) exposure to covid-19: Secondary | ICD-10-CM | POA: Diagnosis not present

## 2021-07-05 DIAGNOSIS — K5669 Other partial intestinal obstruction: Secondary | ICD-10-CM | POA: Diagnosis not present

## 2021-07-05 DIAGNOSIS — K5090 Crohn's disease, unspecified, without complications: Secondary | ICD-10-CM | POA: Diagnosis not present

## 2021-07-05 LAB — CBC
HCT: 37.9 % (ref 36.0–46.0)
Hemoglobin: 12.4 g/dL (ref 12.0–15.0)
MCH: 32.3 pg (ref 26.0–34.0)
MCHC: 32.7 g/dL (ref 30.0–36.0)
MCV: 98.7 fL (ref 80.0–100.0)
Platelets: 209 10*3/uL (ref 150–400)
RBC: 3.84 MIL/uL — ABNORMAL LOW (ref 3.87–5.11)
RDW: 13.3 % (ref 11.5–15.5)
WBC: 5.6 10*3/uL (ref 4.0–10.5)
nRBC: 0 % (ref 0.0–0.2)

## 2021-07-05 LAB — BASIC METABOLIC PANEL
Anion gap: 9 (ref 5–15)
BUN: 9 mg/dL (ref 8–23)
CO2: 17 mmol/L — ABNORMAL LOW (ref 22–32)
Calcium: 8.7 mg/dL — ABNORMAL LOW (ref 8.9–10.3)
Chloride: 114 mmol/L — ABNORMAL HIGH (ref 98–111)
Creatinine, Ser: 0.34 mg/dL — ABNORMAL LOW (ref 0.44–1.00)
GFR, Estimated: >60 mL/min (ref >60)
Glucose, Bld: 75 mg/dL (ref 70–99)
Potassium: 3.4 mmol/L — ABNORMAL LOW (ref 3.5–5.1)
Sodium: 140 mmol/L (ref 135–145)

## 2021-07-05 LAB — HIV ANTIBODY (ROUTINE TESTING W REFLEX): HIV Screen 4th Generation wRfx: NONREACTIVE

## 2021-07-05 LAB — MAGNESIUM: Magnesium: 2 mg/dL (ref 1.7–2.4)

## 2021-07-05 LAB — PHOSPHORUS: Phosphorus: 2.6 mg/dL (ref 2.5–4.6)

## 2021-07-05 MED ORDER — SODIUM BICARBONATE 8.4 % IV SOLN
INTRAVENOUS | Status: DC
  Filled 2021-07-05 (×2): qty 150

## 2021-07-05 MED ORDER — POTASSIUM CHLORIDE 10 MEQ/100ML IV SOLN
10.0000 meq | INTRAVENOUS | Status: AC
  Administered 2021-07-05 (×4): 10 meq via INTRAVENOUS
  Filled 2021-07-05 (×3): qty 100

## 2021-07-05 NOTE — Progress Notes (Signed)
Subjective/Chief Complaint: Passed some gas and had a small BM.  Feeling improved.     Objective: Vital signs in last 24 hours: Temp:  [97.7 F (36.5 C)-98.2 F (36.8 C)] 98.2 F (36.8 C) (11/05 1044) Pulse Rate:  [65-87] 87 (11/05 1044) Resp:  [16-20] 20 (11/05 1044) BP: (121-142)/(63-78) 133/78 (11/05 1044) SpO2:  [96 %-100 %] 99 % (11/05 1044) Weight:  [67.3 kg] 67.3 kg (11/04 2202)    Intake/Output from previous day: 11/04 0701 - 11/05 0700 In: 802.6 [I.V.:802.6] Out: -  Intake/Output this shift: No intake/output data recorded.  General appearance: alert, cooperative, and no distress Resp: breathing comfortably GI: soft, non distended, non tender, no masses palpated.  Extremities: extremities normal, atraumatic, no cyanosis or edema  Lab Results:  Recent Labs    07/04/21 0955 07/05/21 0602  WBC 8.1 5.6  HGB 13.3 12.4  HCT 40.2 37.9  PLT 227 209   BMET Recent Labs    07/04/21 0955 07/05/21 0602  NA 140 140  K 3.6 3.4*  CL 113* 114*  CO2 21* 17*  GLUCOSE 87 75  BUN 12 9  CREATININE 0.35* 0.34*  CALCIUM 9.1 8.7*   PT/INR No results for input(s): LABPROT, INR in the last 72 hours. ABG No results for input(s): PHART, HCO3 in the last 72 hours.  Invalid input(s): PCO2, PO2  Studies/Results: CT ABDOMEN PELVIS W CONTRAST  Result Date: 07/04/2021 CLINICAL DATA:  Evaluate for bowel obstruction. EXAM: CT ABDOMEN AND PELVIS WITH CONTRAST TECHNIQUE: Multidetector CT imaging of the abdomen and pelvis was performed using the standard protocol following bolus administration of intravenous contrast. CONTRAST:  34mL OMNIPAQUE IOHEXOL 350 MG/ML SOLN COMPARISON:  03/10/2015 FINDINGS: Lower chest: No acute abnormality. Hepatobiliary: No focal liver abnormality. Gallbladder appears normal. Mild intrahepatic biliary dilatation. The common bile duct measures 7 mm in maximum diameter. No obstructing mass or signs of choledocholithiasis by CT. Pancreas: Unremarkable. No  pancreatic ductal dilatation or surrounding inflammatory changes. Spleen: Normal in size without focal abnormality. Adrenals/Urinary Tract: Normal adrenal glands. Exophytic cyst arising off inferior pole of the left kidney measures 1.1 cm, image 36/2. No hydronephrosis. Evaluation of the bladder in surrounding pelvic organs is significantly diminished due to streak artifact from bilateral hip arthroplasty devices. Stomach/Bowel: Stomach is normal. Proximal small bowel loops are unremarkable. Signs of previous right hemicolectomy with enterocolonic anastomosis. Increase caliber of the distal small bowel loops with surrounding soft tissue stranding and fluid is noted in the right hemiabdomen. The dilated small bowel loops measure 3.3 cm in diameter up to the level of the enterocolonic anastomosis, image 41/5. The enterocolonic anastomosis appears patent. However, there is wall thickening involving the proximal colon at the level of the anastomosis. Additionally, just beyond the anastomosis is a focal area of luminal narrowing involving the proximal transverse colon, image 36/5 and image 38/2. This is concerning for post anastomotic stricture. The remaining portions of the colon are unremarkable. Assessment of the sigmoid colon and rectum is markedly diminished due to beam hardening artifact from patient's bilateral hip prostheses. Vascular/Lymphatic: No significant vascular findings are present. No enlarged abdominal or pelvic lymph nodes. Reproductive: Evaluation of the reproductive organs is markedly diminished due to beam hardening artifact from bilateral hip prostheses. Simple appearing cyst within the left ovary measures 2.2, image 59/2. Other: Mild fluid is noted within the right abdomen at the level of the enterocolonic anastomosis. Musculoskeletal: Status post bilateral hip arthroplasty. IMPRESSION: 1. Signs of previous right hemicolectomy with enterocolonic anastomosis. Increase caliber  of the distal small  bowel loops with surrounding soft tissue stranding and fluid is noted in the right hemiabdomen. Although the anastomosis appears patent there is wall thickening involving the proximal colon at the level of the anastomosis. Additionally, just beyond the anastomosis is a focal area of luminal narrowing involving the proximal transverse colon. This is concerning for post anastomotic stricture resulting in distal small bowel obstruction. 2. Mild intrahepatic biliary dilatation and common bile duct dilatation. No obstructing mass or signs of choledocholithiasis by CT. 3. Left ovary cyst measures 2.2 cm.  No follow-up imaging indicated. Electronically Signed   By: Kerby Moors M.D.   On: 07/04/2021 12:17    Anti-infectives: Anti-infectives (From admission, onward)    None       Assessment/Plan: Crohn's disease Anastamotic stricture Partial SBO  Improved. Ok to advance diet. Follow up with GI and colorectal surgery if needed.     LOS: 0 days    [name] 07/05/2021

## 2021-07-05 NOTE — Progress Notes (Signed)
PROGRESS NOTE    [name]  GBT:[id number] DOB: 1955-09-23 DOA: 07/04/2021 PCP: Isaac Bliss, Rayford Halsted, MD  Brief Narrative:  The patient is a 65 year old overweight Caucasian female with a past medical history significant for but not limited to osteoarthritis, history of right foot drop, history migraine headaches, history of chronic pancreatitis, history of Crohn's disease with associated history of bowel obstruction, history of SBO episodes most recently 5 to 6 years ago who presents to the ED with abdominal pain and nausea since early morning yesterday.  She denies any emesis, diarrhea constipation melena hematochezia.  She states that her SBO in the past and abdominal pain usually resolved with analgesics, antiemetics, IV fluids and bowel rest.  Denies any flank pain, frequency or any hematuria and initial presentation in the ED showed that she had stable vital signs and she received a 1 L normal saline bolus as well as 50 mcg of fentanyl for pain control as well as Zofran.  CT scan of the abdomen pelvis was done and showed a previous right hemicolectomy with enterocolonic anastomosis.  There was increased caliber of the distal small bowel loops with surrounding soft tissue stranding and fluid in the right hemiabdomen.  Anastomosis appeared to be patent there is focal area of luminal narrowing involving the proximal transverse colon and there is also mild intrahepatic biliary and CBD dilatation.  She was admitted for small bowel obstruction and kept n.p.o. and then placed on a clear liquid diet which were then advanced to a full liquid diet.  She is given antiemetics as needed and general surgery was consulted.  Assessment & Plan:   Principal Problem:   SBO (small bowel obstruction) (HCC) Active Problems:   Migraine   Crohn disease ([place])  Small bowel obstruction due to Anastomotic Stricture with history of Crohn's disease and Crohn's ileitis s/p Right Hemicolectomy  -Present on  admission -CT Abd/Pelvis showed "Signs of previous right hemicolectomy with enterocolonic anastomosis. Increase caliber of the distal small bowel loops with surrounding soft tissue stranding and fluid is noted in the right hemiabdomen. Although the anastomosis appears patent there is wall thickening involving the proximal colon at the level of the anastomosis. Additionally, just beyond the anastomosis is a focal area of luminal narrowing involving the proximal transverse colon. This is concerning for post anastomotic stricture resulting in distal small bowel obstruction. Mild intrahepatic biliary dilatation and common bile duct dilatation. No obstructing mass or signs of choledocholithiasis by CT. Left ovary cyst measures 2.2 cm.  No follow-up imaging indicated." -Diet Advancement per General Surgery -Now on a FULL Liquid Diet -C/w Supportive Care and C/w Antiemetics with Ondansetron -General Surgery Consulted and appreciate Recc's  -C/w Analgesics with IV Hydromorphone -Keep Mag >2.0 and K+ > 4.0 -Repeat KUB in the AM   Metabolic Acidosis -She had metabolic acidosis on admission which slightly worsened.  Her CO2 is now 17, anion gap is 9, chloride level is 114 -Started the patient on the sodium bicarbonate drip -Continue monitor and trend and repeat CMP in a.m.  Hypokalemia -Patient's potassium was 3.4 -Replete with IV KCl 40 mEq -Check magnesium level -Continue to monitor and replete as necessary -Repeat CMP in the a.m.  Migraine headaches -Continue sumatriptan 3 mg subcu daily as needed for migraine  GERD/GI prophylaxis -Continue with pantoprazole 40 mg IV q. 24  DVT prophylaxis: Enoxaparin 40 mg sq q24h Code Status: FULL CODE  Family Communication: No family present at bedside  Disposition Plan: Pending further clinical improvement and  clearance by general surgery  Status is: Inpatient  Remains inpatient appropriate because: She will need to tolerate her diet and have general  surgery clearance prior to safe discharge disposition  Consultants:  General Surgery   Procedures: None  Antimicrobials:  Anti-infectives (From admission, onward)    None        Subjective: Seen and examined at bedside and the patient was doing okay.  Still had some mild abdominal discomfort but not as much as she was having.  Denies any lightheadedness or dizziness.  No nausea or vomiting no other concerns or complaints at this time.  Objective: Vitals:   07/05/21 0144 07/05/21 0616 07/05/21 1044 07/05/21 1405  BP: (!) 142/72 121/64 133/78 136/68  Pulse: 74 77 87 75  Resp: 16 18 20  (!) 21  Temp: 97.7 F (36.5 C) 98 F (36.7 C) 98.2 F (36.8 C) 98.3 F (36.8 C)  TempSrc: Oral Oral Oral Oral  SpO2: 96% 97% 99% 100%  Weight:      Height:        Intake/Output Summary (Last 24 hours) at 07/05/2021 1842 Last data filed at 07/05/2021 0230 Gross per 24 hour  Intake 802.59 ml  Output --  Net 802.59 ml   Filed Weights   07/04/21 0946 07/04/21 2202  Weight: 63.5 kg 67.3 kg   Examination: Physical Exam:  Constitutional: WN/WD overweight Caucasian female in NAD and appears calm and comfortable Eyes: Lids and conjunctivae normal, sclerae anicteric  ENMT: External Ears, Nose appear normal. Grossly normal hearing.   Neck: Appears normal, supple, no cervical masses, normal ROM, no appreciable thyromegaly; no JVD Respiratory: Diminished to auscultation bilaterally, no wheezing, rales, rhonchi or crackles. Normal respiratory effort and patient is not tachypenic. No accessory muscle use. Unlabored breathing  Cardiovascular: RRR, no murmurs / rubs / gallops. S1 and S2 auscultated. No extremity edema Abdomen: Soft, mildly-tender, Distended 2/2 to body habitus.  Bowel sounds positive.  GU: Deferred. Musculoskeletal: No clubbing / cyanosis of digits/nails. No joint deformity upper and lower extremities. Skin: No rashes, lesions, ulcers on a limited skin evaluation. No induration;  Warm and dry.  Neurologic: CN 2-12 grossly intact with no focal deficits. Romberg sign and cerebellar reflexes not assessed.  Psychiatric: Normal judgment and insight. Alert and oriented x 3. Normal mood and appropriate affect.   Data Reviewed: I have personally reviewed following labs and imaging studies  CBC: Recent Labs  Lab 07/04/21 0955 07/05/21 0602  WBC 8.1 5.6  NEUTROABS 6.5  --   HGB 13.3 12.4  HCT 40.2 37.9  MCV 97.6 98.7  PLT 227 161   Basic Metabolic Panel: Recent Labs  Lab 07/04/21 0955 07/05/21 0602  NA 140 140  K 3.6 3.4*  CL 113* 114*  CO2 21* 17*  GLUCOSE 87 75  BUN 12 9  CREATININE 0.35* 0.34*  CALCIUM 9.1 8.7*  MG 2.1 2.0  PHOS  --  2.6   GFR: Estimated Creatinine Clearance: 61.5 mL/min (A) (by C-G formula based on SCr of 0.34 mg/dL (L)). Liver Function Tests: Recent Labs  Lab 07/04/21 0955  AST 16  ALT 12  ALKPHOS 60  BILITOT 0.8  PROT 7.4  ALBUMIN 4.2   Recent Labs  Lab 07/04/21 0955  LIPASE 27   No results for input(s): AMMONIA in the last 168 hours. Coagulation Profile: No results for input(s): INR, PROTIME in the last 168 hours. Cardiac Enzymes: No results for input(s): CKTOTAL, CKMB, CKMBINDEX, TROPONINI in the last 168 hours. BNP (last  3 results) No results for input(s): PROBNP in the last 8760 hours. HbA1C: No results for input(s): HGBA1C in the last 72 hours. CBG: No results for input(s): GLUCAP in the last 168 hours. Lipid Profile: No results for input(s): CHOL, HDL, LDLCALC, TRIG, CHOLHDL, LDLDIRECT in the last 72 hours. Thyroid Function Tests: No results for input(s): TSH, T4TOTAL, FREET4, T3FREE, THYROIDAB in the last 72 hours. Anemia Panel: No results for input(s): VITAMINB12, FOLATE, FERRITIN, TIBC, IRON, RETICCTPCT in the last 72 hours. Sepsis Labs: No results for input(s): PROCALCITON, LATICACIDVEN in the last 168 hours.  Recent Results (from the past 240 hour(s))  Resp Panel by RT-PCR (Flu A&B, Covid)  Nasopharyngeal Swab     Status: None   Collection Time: 07/04/21 12:38 PM   Specimen: Nasopharyngeal Swab; Nasopharyngeal(NP) swabs in vial transport medium  Result Value Ref Range Status   SARS Coronavirus 2 by RT PCR NEGATIVE NEGATIVE Final    Comment: (NOTE) SARS-CoV-2 target nucleic acids are NOT DETECTED.  The SARS-CoV-2 RNA is generally detectable in upper respiratory specimens during the acute phase of infection. The lowest concentration of SARS-CoV-2 viral copies this assay can detect is 138 copies/mL. A negative result does not preclude SARS-Cov-2 infection and should not be used as the sole basis for treatment or other patient management decisions. A negative result may occur with  improper specimen collection/handling, submission of specimen other than nasopharyngeal swab, presence of viral mutation(s) within the areas targeted by this assay, and inadequate number of viral copies(<138 copies/mL). A negative result must be combined with clinical observations, patient history, and epidemiological information. The expected result is Negative.  Fact Sheet for Patients:  EntrepreneurPulse.com.au  Fact Sheet for Healthcare Providers:  IncredibleEmployment.be  This test is no t yet approved or cleared by the [place] FDA and  has been authorized for detection and/or diagnosis of SARS-CoV-2 by FDA under an Emergency Use Authorization (EUA). This EUA will remain  in effect (meaning this test can be used) for the duration of the COVID-19 declaration under Section 564(b)(1) of the Act, 21 U.S.C.section 360bbb-3(b)(1), unless the authorization is terminated  or revoked sooner.       Influenza A by PCR NEGATIVE NEGATIVE Final   Influenza B by PCR NEGATIVE NEGATIVE Final    Comment: (NOTE) The Xpert Xpress SARS-CoV-2/FLU/RSV plus assay is intended as an aid in the diagnosis of influenza from Nasopharyngeal swab specimens and should not be  used as a sole basis for treatment. Nasal washings and aspirates are unacceptable for Xpert Xpress SARS-CoV-2/FLU/RSV testing.  Fact Sheet for Patients: EntrepreneurPulse.com.au  Fact Sheet for Healthcare Providers: IncredibleEmployment.be  This test is not yet approved or cleared by the [place] FDA and has been authorized for detection and/or diagnosis of SARS-CoV-2 by FDA under an Emergency Use Authorization (EUA). This EUA will remain in effect (meaning this test can be used) for the duration of the COVID-19 declaration under Section 564(b)(1) of the Act, 21 U.S.C. section 360bbb-3(b)(1), unless the authorization is terminated or revoked.  Performed at [facility], [place] [address] [place], [place] [address]     RN Pressure Injury Documentation:     Estimated body mass index is 28.03 kg/m as calculated from the following:   Height as of this encounter: 5\' 1"  (1.549 m).   Weight as of this encounter: 67.3 kg.  Malnutrition Type:   Malnutrition Characteristics:   Nutrition Interventions:    Radiology Studies: CT ABDOMEN PELVIS W CONTRAST  Result Date: 07/04/2021  CLINICAL DATA:  Evaluate for bowel obstruction. EXAM: CT ABDOMEN AND PELVIS WITH CONTRAST TECHNIQUE: Multidetector CT imaging of the abdomen and pelvis was performed using the standard protocol following bolus administration of intravenous contrast. CONTRAST:  33mL OMNIPAQUE IOHEXOL 350 MG/ML SOLN COMPARISON:  03/10/2015 FINDINGS: Lower chest: No acute abnormality. Hepatobiliary: No focal liver abnormality. Gallbladder appears normal. Mild intrahepatic biliary dilatation. The common bile duct measures 7 mm in maximum diameter. No obstructing mass or signs of choledocholithiasis by CT. Pancreas: Unremarkable. No pancreatic ductal dilatation or surrounding inflammatory changes. Spleen: Normal in size without focal abnormality. Adrenals/Urinary Tract: Normal  adrenal glands. Exophytic cyst arising off inferior pole of the left kidney measures 1.1 cm, image 36/2. No hydronephrosis. Evaluation of the bladder in surrounding pelvic organs is significantly diminished due to streak artifact from bilateral hip arthroplasty devices. Stomach/Bowel: Stomach is normal. Proximal small bowel loops are unremarkable. Signs of previous right hemicolectomy with enterocolonic anastomosis. Increase caliber of the distal small bowel loops with surrounding soft tissue stranding and fluid is noted in the right hemiabdomen. The dilated small bowel loops measure 3.3 cm in diameter up to the level of the enterocolonic anastomosis, image 41/5. The enterocolonic anastomosis appears patent. However, there is wall thickening involving the proximal colon at the level of the anastomosis. Additionally, just beyond the anastomosis is a focal area of luminal narrowing involving the proximal transverse colon, image 36/5 and image 38/2. This is concerning for post anastomotic stricture. The remaining portions of the colon are unremarkable. Assessment of the sigmoid colon and rectum is markedly diminished due to beam hardening artifact from patient's bilateral hip prostheses. Vascular/Lymphatic: No significant vascular findings are present. No enlarged abdominal or pelvic lymph nodes. Reproductive: Evaluation of the reproductive organs is markedly diminished due to beam hardening artifact from bilateral hip prostheses. Simple appearing cyst within the left ovary measures 2.2, image 59/2. Other: Mild fluid is noted within the right abdomen at the level of the enterocolonic anastomosis. Musculoskeletal: Status post bilateral hip arthroplasty. IMPRESSION: 1. Signs of previous right hemicolectomy with enterocolonic anastomosis. Increase caliber of the distal small bowel loops with surrounding soft tissue stranding and fluid is noted in the right hemiabdomen. Although the anastomosis appears patent there is wall  thickening involving the proximal colon at the level of the anastomosis. Additionally, just beyond the anastomosis is a focal area of luminal narrowing involving the proximal transverse colon. This is concerning for post anastomotic stricture resulting in distal small bowel obstruction. 2. Mild intrahepatic biliary dilatation and common bile duct dilatation. No obstructing mass or signs of choledocholithiasis by CT. 3. Left ovary cyst measures 2.2 cm.  No follow-up imaging indicated. Electronically Signed   By: Kerby Moors M.D.   On: 07/04/2021 12:17    Scheduled Meds:  enoxaparin (LOVENOX) injection  40 mg Subcutaneous Q24H   pantoprazole (PROTONIX) IV  40 mg Intravenous Q24H   Continuous Infusions:  sodium bicarbonate 150 mEq in D5W infusion 75 mL/hr at 07/05/21 1237    LOS: 0 days   Kerney Elbe, DO [facility] PAGER is on [place]  If 7PM-7AM, please contact night-coverage [email or web address]

## 2021-07-06 ENCOUNTER — Inpatient Hospital Stay (HOSPITAL_COMMUNITY): Payer: Medicare HMO

## 2021-07-06 DIAGNOSIS — K913 Postprocedural intestinal obstruction, unspecified as to partial versus complete: Secondary | ICD-10-CM | POA: Diagnosis not present

## 2021-07-06 DIAGNOSIS — Z9049 Acquired absence of other specified parts of digestive tract: Secondary | ICD-10-CM | POA: Diagnosis not present

## 2021-07-06 DIAGNOSIS — Z96643 Presence of artificial hip joint, bilateral: Secondary | ICD-10-CM | POA: Diagnosis not present

## 2021-07-06 DIAGNOSIS — G43709 Chronic migraine without aura, not intractable, without status migrainosus: Secondary | ICD-10-CM | POA: Diagnosis not present

## 2021-07-06 DIAGNOSIS — E876 Hypokalemia: Secondary | ICD-10-CM | POA: Diagnosis not present

## 2021-07-06 DIAGNOSIS — K56609 Unspecified intestinal obstruction, unspecified as to partial versus complete obstruction: Secondary | ICD-10-CM | POA: Diagnosis not present

## 2021-07-06 LAB — CBC WITH DIFFERENTIAL/PLATELET
Abs Immature Granulocytes: 0.01 10*3/uL (ref 0.00–0.07)
Basophils Absolute: 0 10*3/uL (ref 0.0–0.1)
Basophils Relative: 1 %
Eosinophils Absolute: 0 10*3/uL (ref 0.0–0.5)
Eosinophils Relative: 1 %
HCT: 35.6 % — ABNORMAL LOW (ref 36.0–46.0)
Hemoglobin: 11.9 g/dL — ABNORMAL LOW (ref 12.0–15.0)
Immature Granulocytes: 0 %
Lymphocytes Relative: 25 %
Lymphs Abs: 1.1 10*3/uL (ref 0.7–4.0)
MCH: 32.2 pg (ref 26.0–34.0)
MCHC: 33.4 g/dL (ref 30.0–36.0)
MCV: 96.5 fL (ref 80.0–100.0)
Monocytes Absolute: 0.4 10*3/uL (ref 0.1–1.0)
Monocytes Relative: 10 %
Neutro Abs: 2.9 10*3/uL (ref 1.7–7.7)
Neutrophils Relative %: 63 %
Platelets: 216 10*3/uL (ref 150–400)
RBC: 3.69 MIL/uL — ABNORMAL LOW (ref 3.87–5.11)
RDW: 13.2 % (ref 11.5–15.5)
WBC: 4.6 10*3/uL (ref 4.0–10.5)
nRBC: 0 % (ref 0.0–0.2)

## 2021-07-06 LAB — COMPREHENSIVE METABOLIC PANEL
ALT: 13 U/L (ref 0–44)
AST: 19 U/L (ref 15–41)
Albumin: 3.7 g/dL (ref 3.5–5.0)
Alkaline Phosphatase: 52 U/L (ref 38–126)
Anion gap: 7 (ref 5–15)
BUN: <5 mg/dL — ABNORMAL LOW (ref 8–23)
CO2: 24 mmol/L (ref 22–32)
Calcium: 8.9 mg/dL (ref 8.9–10.3)
Chloride: 111 mmol/L (ref 98–111)
Creatinine, Ser: <0.3 mg/dL — ABNORMAL LOW (ref 0.44–1.00)
Glucose, Bld: 103 mg/dL — ABNORMAL HIGH (ref 70–99)
Potassium: 3.2 mmol/L — ABNORMAL LOW (ref 3.5–5.1)
Sodium: 142 mmol/L (ref 135–145)
Total Bilirubin: 0.7 mg/dL (ref 0.3–1.2)
Total Protein: 6.4 g/dL — ABNORMAL LOW (ref 6.5–8.1)

## 2021-07-06 LAB — MAGNESIUM: Magnesium: 1.9 mg/dL (ref 1.7–2.4)

## 2021-07-06 LAB — PHOSPHORUS: Phosphorus: 2 mg/dL — ABNORMAL LOW (ref 2.5–4.6)

## 2021-07-06 MED ORDER — POTASSIUM PHOSPHATES 15 MMOLE/5ML IV SOLN
20.0000 mmol | Freq: Once | INTRAVENOUS | Status: AC
  Administered 2021-07-06: 20 mmol via INTRAVENOUS
  Filled 2021-07-06: qty 6.67

## 2021-07-06 MED ORDER — SODIUM CHLORIDE 0.9 % IV SOLN: INTRAVENOUS | Status: DC

## 2021-07-06 MED ORDER — ONDANSETRON HCL 4 MG PO TABS: 4.0000 mg | ORAL_TABLET | Freq: Four times a day (QID) | ORAL | 0 refills | Status: DC | PRN

## 2021-07-06 MED ORDER — ACETAMINOPHEN 325 MG PO TABS: 650.0000 mg | ORAL_TABLET | Freq: Four times a day (QID) | ORAL | 0 refills | Status: DC | PRN

## 2021-07-06 MED ORDER — K PHOS MONO-SOD PHOS DI & MONO 155-852-130 MG PO TABS
500.0000 mg | ORAL_TABLET | Freq: Once | ORAL | Status: AC
  Administered 2021-07-06: 500 mg via ORAL
  Filled 2021-07-06: qty 2

## 2021-07-06 MED ORDER — POTASSIUM CHLORIDE CRYS ER 20 MEQ PO TBCR
40.0000 meq | EXTENDED_RELEASE_TABLET | Freq: Once | ORAL | Status: AC
  Administered 2021-07-06: 40 meq via ORAL
  Filled 2021-07-06: qty 2

## 2021-07-06 NOTE — Discharge Summary (Signed)
Physician Discharge Summary  [name] XLK:[id number] DOB: 11-Jan-1956 DOA: 07/04/2021  PCP: Isaac Bliss, Rayford Halsted, MD  Admit date: 07/04/2021 Discharge date: 07/06/2021  Admitted From: Home Disposition: Home  Recommendations for Outpatient Follow-up:  Follow up with PCP in 1-2 weeks Follow up with General Surgery Colorectal Surgery within 1-2 weeks Follow up with Gastroenterology as needed  Please obtain CMP/CBC, Mag, Phos in one week Please follow up on the following pending results:  Home Health: No Equipment/Devices: None    Discharge Condition: Stable  CODE STATUS: FULL CODE  Diet recommendation: Soft Diet   Brief/Interim Summary: The patient is a 65 year old overweight Caucasian female with a past medical history significant for but not limited to osteoarthritis, history of right foot drop, history migraine headaches, history of chronic pancreatitis, history of Crohn's disease with associated history of bowel obstruction, history of SBO episodes most recently 5 to 6 years ago who presents to the ED with abdominal pain and nausea since early morning yesterday.  She denies any emesis, diarrhea constipation melena hematochezia.  She states that her SBO in the past and abdominal pain usually resolved with analgesics, antiemetics, IV fluids and bowel rest.  Denies any flank pain, frequency or any hematuria and initial presentation in the ED showed that she had stable vital signs and she received a 1 L normal saline bolus as well as 50 mcg of fentanyl for pain control as well as Zofran.  CT scan of the abdomen pelvis was done and showed a previous right hemicolectomy with enterocolonic anastomosis.  There was increased caliber of the distal small bowel loops with surrounding soft tissue stranding and fluid in the right hemiabdomen.  Anastomosis appeared to be patent there is focal area of luminal narrowing involving the proximal transverse colon and there is also mild intrahepatic  biliary and CBD dilatation.  She was admitted for small bowel obstruction and kept n.p.o. and then placed on a clear liquid diet which were then advanced to a full liquid diet.  She is given antiemetics as needed and general surgery was consulted. She improved with conservative measures and IVF hydration and diet was advanced. She was passing flatus and having bowel movements and was deemed medically stable to be D/C and General Surgery felt she was ok to D/C home from a Surgical Standpoint    Discharge Diagnoses:  Principal Problem:   SBO (small bowel obstruction) (HCC) Active Problems:   Migraine   Crohn disease ([place])  Small bowel obstruction due to Anastomotic Stricture with history of Crohn's disease and Crohn's ileitis s/p Right Hemicolectomy, improving -Present on admission -CT Abd/Pelvis showed "Signs of previous right hemicolectomy with enterocolonic anastomosis. Increase caliber of the distal small bowel loops with surrounding soft tissue stranding and fluid is noted in the right hemiabdomen. Although the anastomosis appears patent there is wall thickening involving the proximal colon at the level of the anastomosis. Additionally, just beyond the anastomosis is a focal area of luminal narrowing involving the proximal transverse colon. This is concerning for post anastomotic stricture resulting in distal small bowel obstruction. Mild intrahepatic biliary dilatation and common bile duct dilatation. No obstructing mass or signs of choledocholithiasis by CT. Left ovary cyst measures 2.2 cm.  No follow-up imaging indicated." -Diet Advancement per General Surgery -Now on a FULL Liquid Diet and advance to a soft diet and she is tolerating this well -C/w Supportive Care and C/w Antiemetics with Ondansetron -General Surgery Consulted and appreciate Recc's  -C/w Analgesics with IV Hydromorphone while  hospitalized -Keep Mag >2.0 and K+ > 4.0; magnesium level was 1.9 and potassium was 3.2 -Repeat  KUB this a.m. showed "Enterotomy lines from right hemicolectomy. The bowel gas pattern is nonspecific/nonobstructive. No suspicious calcifications."  -She is passing flatus and having bowel movements and she was deemed stable from a surgical standpoint to discharge home and they recommended following up in outpatient setting with GI as well as colorectal surgeon if necessary.   Metabolic Acidosis, improved -She had metabolic acidosis on admission which slightly worsened.  Her CO2 is now 17, anion gap is 9, chloride level is 114 yesterday -Today CO2 was 24, anion gap is 7, chloride level is 111 -Started the patient on the sodium bicarbonate drip and will stop now that it is improved  -Continue monitor and trend and repeat CMP within 1 week   Hypokalemia -Patient's potassium was 3.2 -Replete with IV K Phos 20 mmol, po K Phos Neutral 500 mg x1, and po Kcl 40 mEQ x1 prior to D/C  -Check magnesium level -Continue to monitor and replete as necessary -Repeat CMP in the a.m.  Hypophosphatemia -Mild. Phos level is now 2.0 -Replete with IV K Phos 20 mmol, po K Phos Neutral 500 mg x1 -Continue to Monitor and Replete as Necessary -Repeat Phos Level within 1 week  Normocytic Anemia -Likely Dilutional Drop from IVF -Patient's Hgb/Hct went from 13.3/40.2 -> 12.4/37.9 -> 11.9/35.6 -Check Anemia Panel in the outpatient setting -Continue to Monitor for S/Sx of Bleeding; No overt bleeding noted -Repeat CBC in the AM    Migraine headaches -Continue Sumatriptan 3 mg subcu daily as needed for migraine   GERD/GI prophylaxis -Continue with Pantoprazole 40 mg IV q. 24 and this was changed to po   Discharge Instructions  Discharge Instructions     Call MD for:  difficulty breathing, headache or visual disturbances   Complete by: As directed    Call MD for:  extreme fatigue   Complete by: As directed    Call MD for:  hives   Complete by: As directed    Call MD for:  persistant dizziness or  light-headedness   Complete by: As directed    Call MD for:  persistant nausea and vomiting   Complete by: As directed    Call MD for:  redness, tenderness, or signs of infection (pain, swelling, redness, odor or green/yellow discharge around incision site)   Complete by: As directed    Call MD for:  severe uncontrolled pain   Complete by: As directed    Call MD for:  temperature >100.4   Complete by: As directed    Diet - low sodium heart healthy   Complete by: As directed    Discharge instructions   Complete by: As directed    You were cared for by a hospitalist during your hospital stay. If you have any questions about your discharge medications or the care you received while you were in the hospital after you are discharged, you can call the unit and ask to speak with the hospitalist on call if the hospitalist that took care of you is not available. Once you are discharged, your primary care physician will handle any further medical issues. Please note that NO REFILLS for any discharge medications will be authorized once you are discharged, as it is imperative that you return to your primary care physician (or establish a relationship with a primary care physician if you do not have one) for your aftercare needs so that they  can reassess your need for medications and monitor your lab values.  Follow up with PCP and GI and General Surgery as needed. Take all medications as prescribed. If symptoms change or worsen please return to the ED for evaluation   Increase activity slowly   Complete by: As directed       Allergies as of 07/06/2021   No Known Allergies      Medication List     TAKE these medications    acetaminophen 325 MG tablet Commonly known as: TYLENOL Take 2 tablets (650 mg total) by mouth every 6 (six) hours as needed for mild pain (or Fever >/= 101).   ondansetron 4 MG tablet Commonly known as: ZOFRAN Take 1 tablet (4 mg total) by mouth every 6 (six) hours as  needed for nausea.   oxyCODONE-acetaminophen 5-325 MG tablet Commonly known as: Percocet Take 1-2 tablets by mouth every 6 (six) hours as needed for severe pain.   polyvinyl alcohol 1.4 % ophthalmic solution Commonly known as: LIQUIFILM TEARS Place 1 drop into both eyes as needed for dry eyes.   topiramate 200 MG tablet Commonly known as: TOPAMAX Take 300 mg by mouth at bedtime.   Zembrace SymTouch 3 MG/0.5ML Soaj Generic drug: SUMAtriptan Succinate Inject 1 Dose into the skin daily as needed for migraine.        No Known Allergies  Consultations: General Surgery  Procedures/Studies: DG Abd 1 View  Result Date: 07/06/2021 CLINICAL DATA:  Follow-up small bowel obstruction. EXAM: ABDOMEN - 1 VIEW COMPARISON:  CT abdomen and pelvis July 04, 2021 FINDINGS: Enterotomy lines from right hemicolectomy. The bowel gas pattern is nonspecific/nonobstructive. No suspicious calcifications. Bilateral hip prostheses. IMPRESSION: Nonspecific/nonobstructive bowel gas pattern. Electronically Signed   By: Fidela Salisbury M.D.   On: 07/06/2021 10:14   CT ABDOMEN PELVIS W CONTRAST  Result Date: 07/04/2021 CLINICAL DATA:  Evaluate for bowel obstruction. EXAM: CT ABDOMEN AND PELVIS WITH CONTRAST TECHNIQUE: Multidetector CT imaging of the abdomen and pelvis was performed using the standard protocol following bolus administration of intravenous contrast. CONTRAST:  57mL OMNIPAQUE IOHEXOL 350 MG/ML SOLN COMPARISON:  03/10/2015 FINDINGS: Lower chest: No acute abnormality. Hepatobiliary: No focal liver abnormality. Gallbladder appears normal. Mild intrahepatic biliary dilatation. The common bile duct measures 7 mm in maximum diameter. No obstructing mass or signs of choledocholithiasis by CT. Pancreas: Unremarkable. No pancreatic ductal dilatation or surrounding inflammatory changes. Spleen: Normal in size without focal abnormality. Adrenals/Urinary Tract: Normal adrenal glands. Exophytic cyst arising  off inferior pole of the left kidney measures 1.1 cm, image 36/2. No hydronephrosis. Evaluation of the bladder in surrounding pelvic organs is significantly diminished due to streak artifact from bilateral hip arthroplasty devices. Stomach/Bowel: Stomach is normal. Proximal small bowel loops are unremarkable. Signs of previous right hemicolectomy with enterocolonic anastomosis. Increase caliber of the distal small bowel loops with surrounding soft tissue stranding and fluid is noted in the right hemiabdomen. The dilated small bowel loops measure 3.3 cm in diameter up to the level of the enterocolonic anastomosis, image 41/5. The enterocolonic anastomosis appears patent. However, there is wall thickening involving the proximal colon at the level of the anastomosis. Additionally, just beyond the anastomosis is a focal area of luminal narrowing involving the proximal transverse colon, image 36/5 and image 38/2. This is concerning for post anastomotic stricture. The remaining portions of the colon are unremarkable. Assessment of the sigmoid colon and rectum is markedly diminished due to beam hardening artifact from patient's bilateral hip prostheses. Vascular/Lymphatic: No  significant vascular findings are present. No enlarged abdominal or pelvic lymph nodes. Reproductive: Evaluation of the reproductive organs is markedly diminished due to beam hardening artifact from bilateral hip prostheses. Simple appearing cyst within the left ovary measures 2.2, image 59/2. Other: Mild fluid is noted within the right abdomen at the level of the enterocolonic anastomosis. Musculoskeletal: Status post bilateral hip arthroplasty. IMPRESSION: 1. Signs of previous right hemicolectomy with enterocolonic anastomosis. Increase caliber of the distal small bowel loops with surrounding soft tissue stranding and fluid is noted in the right hemiabdomen. Although the anastomosis appears patent there is wall thickening involving the proximal  colon at the level of the anastomosis. Additionally, just beyond the anastomosis is a focal area of luminal narrowing involving the proximal transverse colon. This is concerning for post anastomotic stricture resulting in distal small bowel obstruction. 2. Mild intrahepatic biliary dilatation and common bile duct dilatation. No obstructing mass or signs of choledocholithiasis by CT. 3. Left ovary cyst measures 2.2 cm.  No follow-up imaging indicated. Electronically Signed   By: Kerby Moors M.D.   On: 07/04/2021 12:17     Subjective: Seen and examined at bedside and she feels much better.  No nausea or vomiting.  Passing a lot of gas and having bowel movements.  No chest pain or shortness of breath.  Feels well and back to her baseline and she is deemed medically stable to be discharged and will follow up with PCP as well as general surgery in the outpatient setting  Discharge Exam: Vitals:   07/05/21 2044 07/06/21 0505  BP: 130/68 (!) 144/70  Pulse: 76 72  Resp: 18 18  Temp: 98.4 F (36.9 C) 98.4 F (36.9 C)  SpO2: 98% 99%   Vitals:   07/05/21 1044 07/05/21 1405 07/05/21 2044 07/06/21 0505  BP: 133/78 136/68 130/68 (!) 144/70  Pulse: 87 75 76 72  Resp: 20 (!) 21 18 18   Temp: 98.2 F (36.8 C) 98.3 F (36.8 C) 98.4 F (36.9 C) 98.4 F (36.9 C)  TempSrc: Oral Oral Oral Oral  SpO2: 99% 100% 98% 99%  Weight:      Height:       General: Pt is alert, awake, not in acute distress Cardiovascular: RRR, S1/S2 +, no rubs, no gallops Respiratory: Diminished bilaterally, no wheezing, no rhonchi; unlabored breathing Abdominal: Soft, NT, distended, bowel sounds + Extremities: no edema, no cyanosis  The results of significant diagnostics from this hospitalization (including imaging, microbiology, ancillary and laboratory) are listed below for reference.    Microbiology: Recent Results (from the past 240 hour(s))  Resp Panel by RT-PCR (Flu A&B, Covid) Nasopharyngeal Swab     Status: None    Collection Time: 07/04/21 12:38 PM   Specimen: Nasopharyngeal Swab; Nasopharyngeal(NP) swabs in vial transport medium  Result Value Ref Range Status   SARS Coronavirus 2 by RT PCR NEGATIVE NEGATIVE Final    Comment: (NOTE) SARS-CoV-2 target nucleic acids are NOT DETECTED.  The SARS-CoV-2 RNA is generally detectable in upper respiratory specimens during the acute phase of infection. The lowest concentration of SARS-CoV-2 viral copies this assay can detect is 138 copies/mL. A negative result does not preclude SARS-Cov-2 infection and should not be used as the sole basis for treatment or other patient management decisions. A negative result may occur with  improper specimen collection/handling, submission of specimen other than nasopharyngeal swab, presence of viral mutation(s) within the areas targeted by this assay, and inadequate number of viral copies(<138 copies/mL). A negative result must  be combined with clinical observations, patient history, and epidemiological information. The expected result is Negative.  Fact Sheet for Patients:  EntrepreneurPulse.com.au  Fact Sheet for Healthcare Providers:  IncredibleEmployment.be  This test is no t yet approved or cleared by the [place] FDA and  has been authorized for detection and/or diagnosis of SARS-CoV-2 by FDA under an Emergency Use Authorization (EUA). This EUA will remain  in effect (meaning this test can be used) for the duration of the COVID-19 declaration under Section 564(b)(1) of the Act, 21 U.S.C.section 360bbb-3(b)(1), unless the authorization is terminated  or revoked sooner.       Influenza A by PCR NEGATIVE NEGATIVE Final   Influenza B by PCR NEGATIVE NEGATIVE Final    Comment: (NOTE) The Xpert Xpress SARS-CoV-2/FLU/RSV plus assay is intended as an aid in the diagnosis of influenza from Nasopharyngeal swab specimens and should not be used as a sole basis for treatment.  Nasal washings and aspirates are unacceptable for Xpert Xpress SARS-CoV-2/FLU/RSV testing.  Fact Sheet for Patients: EntrepreneurPulse.com.au  Fact Sheet for Healthcare Providers: IncredibleEmployment.be  This test is not yet approved or cleared by the [place] FDA and has been authorized for detection and/or diagnosis of SARS-CoV-2 by FDA under an Emergency Use Authorization (EUA). This EUA will remain in effect (meaning this test can be used) for the duration of the COVID-19 declaration under Section 564(b)(1) of the Act, 21 U.S.C. section 360bbb-3(b)(1), unless the authorization is terminated or revoked.  Performed at [facility], [place] [address] [place], [place] [address]      Labs: BNP (last 3 results) No results for input(s): BNP in the last 8760 hours. Basic Metabolic Panel: Recent Labs  Lab 07/04/21 0955 07/05/21 0602 07/06/21 0650  NA 140 140 142  K 3.6 3.4* 3.2*  CL 113* 114* 111  CO2 21* 17* 24  GLUCOSE 87 75 103*  BUN 12 9 <5*  CREATININE 0.35* 0.34* <0.30*  CALCIUM 9.1 8.7* 8.9  MG 2.1 2.0 1.9  PHOS  --  2.6 2.0*   Liver Function Tests: Recent Labs  Lab 07/04/21 0955 07/06/21 0650  AST 16 19  ALT 12 13  ALKPHOS 60 52  BILITOT 0.8 0.7  PROT 7.4 6.4*  ALBUMIN 4.2 3.7   Recent Labs  Lab 07/04/21 0955  LIPASE 27   No results for input(s): AMMONIA in the last 168 hours. CBC: Recent Labs  Lab 07/04/21 0955 07/05/21 0602 07/06/21 0650  WBC 8.1 5.6 4.6  NEUTROABS 6.5  --  2.9  HGB 13.3 12.4 11.9*  HCT 40.2 37.9 35.6*  MCV 97.6 98.7 96.5  PLT 227 209 216   Cardiac Enzymes: No results for input(s): CKTOTAL, CKMB, CKMBINDEX, TROPONINI in the last 168 hours. BNP: Invalid input(s): POCBNP CBG: No results for input(s): GLUCAP in the last 168 hours. D-Dimer No results for input(s): DDIMER in the last 72 hours. Hgb A1c No results for input(s): HGBA1C in the last 72 hours. Lipid  Profile No results for input(s): CHOL, HDL, LDLCALC, TRIG, CHOLHDL, LDLDIRECT in the last 72 hours. Thyroid function studies No results for input(s): TSH, T4TOTAL, T3FREE, THYROIDAB in the last 72 hours.  Invalid input(s): FREET3 Anemia work up No results for input(s): VITAMINB12, FOLATE, FERRITIN, TIBC, IRON, RETICCTPCT in the last 72 hours. Urinalysis    Component Value Date/Time   COLORURINE STRAW (A) 07/04/2021 1225   APPEARANCEUR CLEAR 07/04/2021 1225   LABSPEC 1.008 07/04/2021 1225   PHURINE 7.0 07/04/2021 1225  GLUCOSEU NEGATIVE 07/04/2021 [place] 07/04/2021 [place] 07/04/2021 1225   BILIRUBINUR neg 02/20/2021 [place] 07/04/2021 1225   PROTEINUR NEGATIVE 07/04/2021 1225   UROBILINOGEN 0.2 02/20/2021 1604   UROBILINOGEN 0.2 03/10/2015 0845   NITRITE NEGATIVE 07/04/2021 1225   LEUKOCYTESUR NEGATIVE 07/04/2021 1225   Sepsis Labs Invalid input(s): PROCALCITONIN,  WBC,  LACTICIDVEN Microbiology Recent Results (from the past 240 hour(s))  Resp Panel by RT-PCR (Flu A&B, Covid) Nasopharyngeal Swab     Status: None   Collection Time: 07/04/21 12:38 PM   Specimen: Nasopharyngeal Swab; Nasopharyngeal(NP) swabs in vial transport medium  Result Value Ref Range Status   SARS Coronavirus 2 by RT PCR NEGATIVE NEGATIVE Final    Comment: (NOTE) SARS-CoV-2 target nucleic acids are NOT DETECTED.  The SARS-CoV-2 RNA is generally detectable in upper respiratory specimens during the acute phase of infection. The lowest concentration of SARS-CoV-2 viral copies this assay can detect is 138 copies/mL. A negative result does not preclude SARS-Cov-2 infection and should not be used as the sole basis for treatment or other patient management decisions. A negative result may occur with  improper specimen collection/handling, submission of specimen other than nasopharyngeal swab, presence of viral mutation(s) within the areas targeted by this  assay, and inadequate number of viral copies(<138 copies/mL). A negative result must be combined with clinical observations, patient history, and epidemiological information. The expected result is Negative.  Fact Sheet for Patients:  EntrepreneurPulse.com.au  Fact Sheet for Healthcare Providers:  IncredibleEmployment.be  This test is no t yet approved or cleared by the [place] FDA and  has been authorized for detection and/or diagnosis of SARS-CoV-2 by FDA under an Emergency Use Authorization (EUA). This EUA will remain  in effect (meaning this test can be used) for the duration of the COVID-19 declaration under Section 564(b)(1) of the Act, 21 U.S.C.section 360bbb-3(b)(1), unless the authorization is terminated  or revoked sooner.       Influenza A by PCR NEGATIVE NEGATIVE Final   Influenza B by PCR NEGATIVE NEGATIVE Final    Comment: (NOTE) The Xpert Xpress SARS-CoV-2/FLU/RSV plus assay is intended as an aid in the diagnosis of influenza from Nasopharyngeal swab specimens and should not be used as a sole basis for treatment. Nasal washings and aspirates are unacceptable for Xpert Xpress SARS-CoV-2/FLU/RSV testing.  Fact Sheet for Patients: EntrepreneurPulse.com.au  Fact Sheet for Healthcare Providers: IncredibleEmployment.be  This test is not yet approved or cleared by the [place] FDA and has been authorized for detection and/or diagnosis of SARS-CoV-2 by FDA under an Emergency Use Authorization (EUA). This EUA will remain in effect (meaning this test can be used) for the duration of the COVID-19 declaration under Section 564(b)(1) of the Act, 21 U.S.C. section 360bbb-3(b)(1), unless the authorization is terminated or revoked.  Performed at [facility], [place] [address], [place], [place] [address]    Time coordinating discharge: 35 minutes  SIGNED:  Kerney Elbe, DO [facility] 07/06/2021, 11:41 AM Pager is on [place]  If 7PM-7AM, please contact night-coverage [email or web address]

## 2021-07-06 NOTE — Progress Notes (Signed)
Patient discharged to home with family, discharge instructions reviewed with patient who verbalized understanding. 

## 2021-07-06 NOTE — Progress Notes (Signed)
Subjective/Chief Complaint: Having lots of flatus and multiple BMs. No n/v on full liquids.     Objective: Vital signs in last 24 hours: Temp:  [98.2 F (36.8 C)-98.4 F (36.9 C)] 98.4 F (36.9 C) (11/06 0505) Pulse Rate:  [72-87] 72 (11/06 0505) Resp:  [18-21] 18 (11/06 0505) BP: (130-144)/(68-78) 144/70 (11/06 0505) SpO2:  [98 %-100 %] 99 % (11/06 0505)    Intake/Output from previous day: No intake/output data recorded. Intake/Output this shift: No intake/output data recorded.  General appearance: alert, cooperative, and no distress Resp: breathing comfortably GI: soft, non distended, non tender, no masses palpated.  Extremities: extremities normal, atraumatic, no cyanosis or edema  Lab Results:  Recent Labs    07/05/21 0602 07/06/21 0650  WBC 5.6 4.6  HGB 12.4 11.9*  HCT 37.9 35.6*  PLT 209 216   BMET Recent Labs    07/05/21 0602 07/06/21 0650  NA 140 142  K 3.4* 3.2*  CL 114* 111  CO2 17* 24  GLUCOSE 75 103*  BUN 9 <5*  CREATININE 0.34* <0.30*  CALCIUM 8.7* 8.9   PT/INR No results for input(s): LABPROT, INR in the last 72 hours. ABG No results for input(s): PHART, HCO3 in the last 72 hours.  Invalid input(s): PCO2, PO2  Studies/Results: CT ABDOMEN PELVIS W CONTRAST  Result Date: 07/04/2021 CLINICAL DATA:  Evaluate for bowel obstruction. EXAM: CT ABDOMEN AND PELVIS WITH CONTRAST TECHNIQUE: Multidetector CT imaging of the abdomen and pelvis was performed using the standard protocol following bolus administration of intravenous contrast. CONTRAST:  24mL OMNIPAQUE IOHEXOL 350 MG/ML SOLN COMPARISON:  03/10/2015 FINDINGS: Lower chest: No acute abnormality. Hepatobiliary: No focal liver abnormality. Gallbladder appears normal. Mild intrahepatic biliary dilatation. The common bile duct measures 7 mm in maximum diameter. No obstructing mass or signs of choledocholithiasis by CT. Pancreas: Unremarkable. No pancreatic ductal dilatation or surrounding  inflammatory changes. Spleen: Normal in size without focal abnormality. Adrenals/Urinary Tract: Normal adrenal glands. Exophytic cyst arising off inferior pole of the left kidney measures 1.1 cm, image 36/2. No hydronephrosis. Evaluation of the bladder in surrounding pelvic organs is significantly diminished due to streak artifact from bilateral hip arthroplasty devices. Stomach/Bowel: Stomach is normal. Proximal small bowel loops are unremarkable. Signs of previous right hemicolectomy with enterocolonic anastomosis. Increase caliber of the distal small bowel loops with surrounding soft tissue stranding and fluid is noted in the right hemiabdomen. The dilated small bowel loops measure 3.3 cm in diameter up to the level of the enterocolonic anastomosis, image 41/5. The enterocolonic anastomosis appears patent. However, there is wall thickening involving the proximal colon at the level of the anastomosis. Additionally, just beyond the anastomosis is a focal area of luminal narrowing involving the proximal transverse colon, image 36/5 and image 38/2. This is concerning for post anastomotic stricture. The remaining portions of the colon are unremarkable. Assessment of the sigmoid colon and rectum is markedly diminished due to beam hardening artifact from patient's bilateral hip prostheses. Vascular/Lymphatic: No significant vascular findings are present. No enlarged abdominal or pelvic lymph nodes. Reproductive: Evaluation of the reproductive organs is markedly diminished due to beam hardening artifact from bilateral hip prostheses. Simple appearing cyst within the left ovary measures 2.2, image 59/2. Other: Mild fluid is noted within the right abdomen at the level of the enterocolonic anastomosis. Musculoskeletal: Status post bilateral hip arthroplasty. IMPRESSION: 1. Signs of previous right hemicolectomy with enterocolonic anastomosis. Increase caliber of the distal small bowel loops with surrounding soft tissue  stranding and fluid  is noted in the right hemiabdomen. Although the anastomosis appears patent there is wall thickening involving the proximal colon at the level of the anastomosis. Additionally, just beyond the anastomosis is a focal area of luminal narrowing involving the proximal transverse colon. This is concerning for post anastomotic stricture resulting in distal small bowel obstruction. 2. Mild intrahepatic biliary dilatation and common bile duct dilatation. No obstructing mass or signs of choledocholithiasis by CT. 3. Left ovary cyst measures 2.2 cm.  No follow-up imaging indicated. Electronically Signed   By: Kerby Moors M.D.   On: 07/04/2021 12:17    Anti-infectives: Anti-infectives (From admission, onward)    None       Assessment/Plan: Crohn's disease Anastamotic stricture Partial SBO  pSBO resolved.  [place] for d/c home today from surgical standpoint.    Follow up with GI and colorectal surgery if needed.     LOS: 1 day    Stark Klein 07/06/2021

## 2021-07-07 ENCOUNTER — Telehealth: Payer: Self-pay

## 2021-07-07 NOTE — Telephone Encounter (Signed)
Transition Care Management Follow-up Telephone Call Date of discharge and from where: 07/06/2021 [name] How have you been since you were released from the hospital? Doing better. Back to work Any questions or concerns? No  Items Reviewed: Did the pt receive and understand the discharge instructions provided? Yes  Medications obtained and verified? Yes  Other? No  Any new allergies since your discharge? No  Dietary orders reviewed? Yes Do you have support at home? Yes   Home Care and Equipment/Supplies: Were home health services ordered? not applicable If so, what is the name of the agency? N/a  Has the agency set up a time to come to the patient's home? not applicable Were any new equipment or medical supplies ordered?  No What is the name of the medical supply agency? N/a Were you able to get the supplies/equipment? not applicable Do you have any questions related to the use of the equipment or supplies? No  Functional Questionnaire: (I = Independent and D = Dependent) ADLs: I  Bathing/Dressing- I  Meal Prep- I  Eating- I  Maintaining continence- I  Transferring/Ambulation- I  Managing Meds- I  Follow up appointments reviewed:  PCP Hospital f/u appt confirmed? Yes  Scheduled to see Dr. Isaac Bliss on 07/17/2021 @ 10:30. Are transportation arrangements needed? No  If their condition worsens, is the pt aware to call PCP or go to the Emergency Dept.? Yes Was the patient provided with contact information for the PCP's office or ED? Yes Was to pt encouraged to call back with questions or concerns? Yes

## 2021-07-17 ENCOUNTER — Encounter: Payer: Self-pay | Admitting: Internal Medicine

## 2021-07-17 ENCOUNTER — Ambulatory Visit (INDEPENDENT_AMBULATORY_CARE_PROVIDER_SITE_OTHER): Payer: Medicare HMO | Admitting: Internal Medicine

## 2021-07-17 VITALS — BP 138/78 | HR 62 | Temp 97.5°F | Ht 61.0 in | Wt 145.5 lb

## 2021-07-17 DIAGNOSIS — K56609 Unspecified intestinal obstruction, unspecified as to partial versus complete obstruction: Secondary | ICD-10-CM

## 2021-07-17 DIAGNOSIS — K50812 Crohn's disease of both small and large intestine with intestinal obstruction: Secondary | ICD-10-CM | POA: Diagnosis not present

## 2021-07-17 DIAGNOSIS — Z09 Encounter for follow-up examination after completed treatment for conditions other than malignant neoplasm: Secondary | ICD-10-CM

## 2021-07-17 LAB — CBC WITH DIFFERENTIAL/PLATELET
Basophils Absolute: 0 10*3/uL (ref 0.0–0.1)
Basophils Relative: 0.6 % (ref 0.0–3.0)
Eosinophils Absolute: 0 10*3/uL (ref 0.0–0.7)
Eosinophils Relative: 0.9 % (ref 0.0–5.0)
HCT: 40 % (ref 36.0–46.0)
Hemoglobin: 13.3 g/dL (ref 12.0–15.0)
Lymphocytes Relative: 29 % (ref 12.0–46.0)
Lymphs Abs: 1.4 10*3/uL (ref 0.7–4.0)
MCHC: 33.2 g/dL (ref 30.0–36.0)
MCV: 96.6 fl (ref 78.0–100.0)
Monocytes Absolute: 0.4 10*3/uL (ref 0.1–1.0)
Monocytes Relative: 8.5 % (ref 3.0–12.0)
Neutro Abs: 3 10*3/uL (ref 1.4–7.7)
Neutrophils Relative %: 61 % (ref 43.0–77.0)
Platelets: 240 10*3/uL (ref 150.0–400.0)
RBC: 4.14 Mil/uL (ref 3.87–5.11)
RDW: 13.9 % (ref 11.5–15.5)
WBC: 5 10*3/uL (ref 4.0–10.5)

## 2021-07-17 LAB — COMPREHENSIVE METABOLIC PANEL
ALT: 10 U/L (ref 0–35)
AST: 14 U/L (ref 0–37)
Albumin: 4.3 g/dL (ref 3.5–5.2)
Alkaline Phosphatase: 60 U/L (ref 39–117)
BUN: 12 mg/dL (ref 6–23)
CO2: 23 mEq/L (ref 19–32)
Calcium: 9.2 mg/dL (ref 8.4–10.5)
Chloride: 110 mEq/L (ref 96–112)
Creatinine, Ser: 0.46 mg/dL (ref 0.40–1.20)
GFR: 100.18 mL/min (ref >60.00)
Glucose, Bld: 69 mg/dL — ABNORMAL LOW (ref 70–99)
Potassium: 3.5 mEq/L (ref 3.5–5.1)
Sodium: 140 mEq/L (ref 135–145)
Total Bilirubin: 0.4 mg/dL (ref 0.2–1.2)
Total Protein: 7 g/dL (ref 6.0–8.3)

## 2021-07-17 LAB — MAGNESIUM: Magnesium: 1.9 mg/dL (ref 1.5–2.5)

## 2021-07-17 LAB — PHOSPHORUS: Phosphorus: 3.4 mg/dL (ref 2.3–4.6)

## 2021-07-17 NOTE — Patient Instructions (Signed)
-  Nice seeing you today!!  -Lab work today; will notify you once results are available.

## 2021-07-17 NOTE — Progress Notes (Signed)
Hospital follow-up visit    CC/Reason for Visit: Hospitalization follow-up  HPI: [name] is a 65 y.o. female who is coming in today for the above mentioned reasons, specifically transitional care services face-to-face visit.    Dates hospitalized: 07/04/2021-07/06/2021 Days since discharge from hospital: 9 Patient was discharged from the hospital to: Home Reason for admission to hospital: Small bowel obstruction Date of interactive phone contact with patient and/or caregiver: 07/07/21  I have reviewed in detail patient's discharge summary plus pertinent specific notes, labs, and images from the hospitalization.  Yes  Patient presented to the hospital on November 4 with abdominal pain and nausea.  She has a history of Crohn's disease and has had repeated episodes of bowel obstructions.  CT scan in the ED showed previous right hemicolectomy with enterocolonic anastomosis with increased caliber of the distal small bowel loops with surrounding soft tissue stranding and fluid in the right hemiabdomen.  She was admitted, she was kept n.p.o., diet was slowly advanced as tolerated.  She was followed by general surgery while hospitalized and eventually she was felt to be medically stable for discharge.  She was found to be hypokalemic, hypomagnesemic and hypophosphatemic during that admission.  She is feeling improved, has had no further abdominal pain, she has been having regular bowel movements although a little loose.  Medication reconciliation was done today and patient is taking meds as recommended by discharging hospitalist/specialist.  Yes   Past Medical/Surgical History: Past Medical History:  Diagnosis Date   Arthritis    Crohn disease ([place]) 07/04/2021   Crohn's disease ([place])    03-26-15 Prednisone sarted 2 weeks ago   Foot drop    right foot-wears brace.   Migraine    On prednisone therapy    started 2 weeks ago   Pancreatitis     Past Surgical History:  Procedure  Laterality Date   BOWEL RESECTION     Crohn's disease   COLONOSCOPY WITH PROPOFOL N/A 04/02/2015   Procedure: COLONOSCOPY WITH PROPOFOL;  Surgeon: Garlan Fair, MD;  Location: [facility];  Service: Endoscopy;  Laterality: N/A;   hip replacement x 5      KNEE SURGERY     scopes   SHOULDER SURGERY     debridement    Social History:  reports that she has never smoked. She has never used smokeless tobacco. She reports current alcohol use. She reports that she does not use drugs.  Allergies: No Known Allergies  Family History:  Family History  Problem Relation Age of Onset   Stroke Mother    Diabetes Mother    COPD Father    Arthritis Father      Current Outpatient Medications:    oxyCODONE-acetaminophen (PERCOCET) 5-325 MG per tablet, Take 1-2 tablets by mouth every 6 (six) hours as needed for severe pain., Disp: 20 tablet, Rfl: 0   polyvinyl alcohol (LIQUIFILM TEARS) 1.4 % ophthalmic solution, Place 1 drop into both eyes as needed for dry eyes., Disp: , Rfl:    topiramate (TOPAMAX) 200 MG tablet, Take 300 mg by mouth at bedtime., Disp: , Rfl:    ZEMBRACE SYMTOUCH 3 MG/0.5ML SOAJ, Inject 1 Dose into the skin daily as needed for migraine., Disp: , Rfl:    acetaminophen (TYLENOL) 325 MG tablet, Take 2 tablets (650 mg total) by mouth every 6 (six) hours as needed for mild pain (or Fever >/= 101). (Patient not taking: Reported on 07/17/2021), Disp: 20 tablet, Rfl: 0  ondansetron (ZOFRAN) 4 MG tablet, Take 1 tablet (4 mg total) by mouth every 6 (six) hours as needed for nausea. (Patient not taking: Reported on 07/17/2021), Disp: 20 tablet, Rfl: 0  Review of Systems:  Constitutional: Denies fever, chills, diaphoresis, appetite change and fatigue.  HEENT: Denies photophobia, eye pain, redness, hearing loss, ear pain, congestion, sore throat, rhinorrhea, sneezing, mouth sores, trouble swallowing, neck pain, neck stiffness and tinnitus.   Respiratory: Denies SOB, DOE, cough, chest  tightness,  and wheezing.   Cardiovascular: Denies chest pain, palpitations and leg swelling.  Gastrointestinal: Denies nausea, vomiting, abdominal pain, diarrhea, constipation, blood in stool and abdominal distention.  Genitourinary: Denies dysuria, urgency, frequency, hematuria, flank pain and difficulty urinating.  Endocrine: Denies: hot or cold intolerance, sweats, changes in hair or nails, polyuria, polydipsia. Musculoskeletal: Denies myalgias, back pain, joint swelling, arthralgias and gait problem.  Skin: Denies pallor, rash and wound.  Neurological: Denies dizziness, seizures, syncope, weakness, light-headedness, numbness and headaches.  Hematological: Denies adenopathy. Easy bruising, personal or family bleeding history  Psychiatric/Behavioral: Denies suicidal ideation, mood changes, confusion, nervousness, sleep disturbance and agitation    Physical Exam: Vitals:   07/17/21 1021  BP: 138/78  Pulse: 62  Temp: (!) 97.5 F (36.4 C)  TempSrc: Oral  SpO2: 100%  Weight: 145 lb 8 oz (66 kg)  Height: 5\' 1"  (1.549 m)    Body mass index is 27.49 kg/m.   Constitutional: NAD, calm, comfortable Eyes: PERRL, lids and conjunctivae normal ENMT: Mucous membranes are moist.  Respiratory: clear to auscultation bilaterally, no wheezing, no crackles. Normal respiratory effort. No accessory muscle use.  Cardiovascular: Regular rate and rhythm, no murmurs / rubs / gallops. No extremity edema.  Abdomen: no tenderness, no masses palpated. No hepatosplenomegaly.  Neurologic: Grossly intact and nonfocal Psychiatric: Normal judgment and insight. Alert and oriented x 3. Normal mood.    Impression and Plan:  Hospital discharge follow-up  SBO (small bowel obstruction) ([place]) Crohn's disease of both small and large intestine with intestinal obstruction (H[facility])  -Will check CBC, CMP, mag, Phos as requested by discharging hospitalist due to severe electrolyte derangements during the  hospitalization. -She has no longer having abdominal pain or nausea.  She is requesting GI referral so I will place today as her previous GI doctor has retired. -She has her annual physical scheduled for next month.   Medical decision making of low complexity was utilized today.  Patient Instructions  -Nice seeing you today!!  -Lab work today; will notify you once results are available.      Lelon Frohlich, MD [facility]

## 2021-08-07 ENCOUNTER — Ambulatory Visit (INDEPENDENT_AMBULATORY_CARE_PROVIDER_SITE_OTHER): Payer: Medicare HMO | Admitting: Internal Medicine

## 2021-08-07 ENCOUNTER — Encounter: Payer: Self-pay | Admitting: Internal Medicine

## 2021-08-07 VITALS — BP 124/80 | HR 70 | Temp 98.1°F | Ht 61.0 in | Wt 148.5 lb

## 2021-08-07 DIAGNOSIS — M21371 Foot drop, right foot: Secondary | ICD-10-CM

## 2021-08-07 DIAGNOSIS — Z23 Encounter for immunization: Secondary | ICD-10-CM | POA: Diagnosis not present

## 2021-08-07 DIAGNOSIS — E559 Vitamin D deficiency, unspecified: Secondary | ICD-10-CM

## 2021-08-07 DIAGNOSIS — K50812 Crohn's disease of both small and large intestine with intestinal obstruction: Secondary | ICD-10-CM | POA: Diagnosis not present

## 2021-08-07 DIAGNOSIS — Z0000 Encounter for general adult medical examination without abnormal findings: Secondary | ICD-10-CM | POA: Diagnosis not present

## 2021-08-07 DIAGNOSIS — R42 Dizziness and giddiness: Secondary | ICD-10-CM | POA: Insufficient documentation

## 2021-08-07 DIAGNOSIS — Z780 Asymptomatic menopausal state: Secondary | ICD-10-CM

## 2021-08-07 DIAGNOSIS — Z13820 Encounter for screening for osteoporosis: Secondary | ICD-10-CM | POA: Diagnosis not present

## 2021-08-07 DIAGNOSIS — Z1211 Encounter for screening for malignant neoplasm of colon: Secondary | ICD-10-CM | POA: Diagnosis not present

## 2021-08-07 NOTE — Progress Notes (Signed)
Established Patient Office Visit     This visit occurred during the SARS-CoV-2 public health emergency.  Safety protocols were in place, including screening questions prior to the visit, additional usage of staff PPE, and extensive cleaning of exam room while observing appropriate contact time as indicated for disinfecting solutions.    CC/Reason for Visit: Welcome to Medicare visit  HPI: [name] is a 65 y.o. female who is coming in today for the above mentioned reasons. Past Medical History is significant for: Crohn's disease, vitamin D deficiency, left foot drop.  She has been feeling well and has no acute concerns.  She has routine eye and dental care.  No perceived hearing issues.  She is overdue for flu, COVID, shingles, pneumonia vaccinations.  She is overdue for bone density test, mammogram.  She had a colonoscopy in 2016.  Pap smear done in 2021.  She is due to establish care with local GI due to her Crohn's disease.   Past Medical/Surgical History: Past Medical History:  Diagnosis Date   Arthritis    Crohn disease ([place]) 07/04/2021   Crohn's disease ([place])    03-26-15 Prednisone sarted 2 weeks ago   Foot drop    right foot-wears brace.   Migraine    On prednisone therapy    started 2 weeks ago   Pancreatitis     Past Surgical History:  Procedure Laterality Date   BOWEL RESECTION     Crohn's disease   COLONOSCOPY WITH PROPOFOL N/A 04/02/2015   Procedure: COLONOSCOPY WITH PROPOFOL;  Surgeon: Garlan Fair, MD;  Location: [facility];  Service: Endoscopy;  Laterality: N/A;   hip replacement x 5      KNEE SURGERY     scopes   SHOULDER SURGERY     debridement    Social History:  reports that she has never smoked. She has never used smokeless tobacco. She reports current alcohol use. She reports that she does not use drugs.  Allergies: No Known Allergies  Family History: Family History  Problem Relation Age of Onset   Stroke Mother    Diabetes  Mother    COPD Father    Arthritis Father      Current Outpatient Medications:    meclizine (ANTIVERT) 12.5 MG tablet, Take 12.5 mg by mouth 3 (three) times daily as needed for dizziness., Disp: , Rfl:    oxyCODONE-acetaminophen (PERCOCET) 5-325 MG per tablet, Take 1-2 tablets by mouth every 6 (six) hours as needed for severe pain., Disp: 20 tablet, Rfl: 0   promethazine (PHENERGAN) 12.5 MG tablet, Take 12.5 mg by mouth every 6 (six) hours as needed for nausea or vomiting., Disp: , Rfl:    topiramate (TOPAMAX) 200 MG tablet, Take 300 mg by mouth at bedtime., Disp: , Rfl:    ZEMBRACE SYMTOUCH 3 MG/0.5ML SOAJ, Inject 1 Dose into the skin daily as needed for migraine., Disp: , Rfl:   Review of Systems:  Constitutional: Denies fever, chills, diaphoresis, appetite change and fatigue.  HEENT: Denies photophobia, eye pain, redness, hearing loss, ear pain, congestion, sore throat, rhinorrhea, sneezing, mouth sores, trouble swallowing, neck pain, neck stiffness and tinnitus.   Respiratory: Denies SOB, DOE, cough, chest tightness,  and wheezing.   Cardiovascular: Denies chest pain, palpitations and leg swelling.  Gastrointestinal: Denies nausea, vomiting, abdominal pain, diarrhea, constipation, blood in stool and abdominal distention.  Genitourinary: Denies dysuria, urgency, frequency, hematuria, flank pain and difficulty urinating.  Endocrine: Denies: hot or cold intolerance, sweats,  changes in hair or nails, polyuria, polydipsia. Musculoskeletal: Denies myalgias, back pain, joint swelling, arthralgias and gait problem.  Skin: Denies pallor, rash and wound.  Neurological: Denies dizziness, seizures, syncope, weakness, light-headedness, numbness and headaches.  Hematological: Denies adenopathy. Easy bruising, personal or family bleeding history  Psychiatric/Behavioral: Denies suicidal ideation, mood changes, confusion, nervousness, sleep disturbance and agitation    Physical Exam: Vitals:    08/07/21 1307  BP: 124/80  Pulse: 70  Temp: 98.1 F (36.7 C)  TempSrc: Oral  SpO2: 98%  Weight: 148 lb 8 oz (67.4 kg)  Height: 5\' 1"  (1.549 m)    Body mass index is 28.06 kg/m.   Constitutional: NAD, calm, comfortable Eyes: PERRL, lids and conjunctivae normal ENMT: Mucous membranes are moist. Posterior pharynx clear of any exudate or lesions. Normal dentition. Tympanic membrane is pearly white, no erythema or bulging. Neck: normal, supple, no masses, no thyromegaly Respiratory: clear to auscultation bilaterally, no wheezing, no crackles. Normal respiratory effort. No accessory muscle use.  Cardiovascular: Regular rate and rhythm, no murmurs / rubs / gallops. No extremity edema. 2+ pedal pulses. No carotid bruits.  Abdomen: no tenderness, no masses palpated. No hepatosplenomegaly. Bowel sounds positive.  Musculoskeletal: no clubbing / cyanosis. No joint deformity upper and lower extremities. Good ROM, no contractures. Normal muscle tone.  Skin: no rashes, lesions, ulcers. No induration Neurologic: CN 2-12 grossly intact. Sensation intact, DTR normal. Strength 5/5 in all 4.  Psychiatric: Normal judgment and insight. Alert and oriented x 3. Normal mood.    Subsequent Medicare wellness visit   1. Risk factors, based on past  M,S,F -cardiovascular disease risk factors, none   2.  Physical activities: Not physically active other than activities of daily living   3.  Depression/mood: Stable, not depressed   4.  Hearing: No perceived issues   5.  ADL's: Independent in all ADLs   6.  Fall risk: Low fall risk   7.  Home safety: No problems identified   8.  Height weight, and visual acuity: height and weight as above, vision:  Vision Screening   Right eye Left eye Both eyes  Without correction 20/25 20/25 20/25   With correction        9.  Counseling: Advise she update her vaccination status   10. Lab orders based on risk factors: Laboratory update will be reviewed   11.  Referral : GI, DEXA, mammogram   12. Care plan: Follow-up with me in 6 months   13. Cognitive assessment: No cognitive impairment   14. Screening: Patient provided with a written and personalized 5-10 year screening schedule in the AVS. yes   15. Provider List Update: PCP  16. Advance Directives: Full code   17. Opioids: Patient is not on any opioid prescriptions and has no risk factors for a substance use disorder.   [facility] Office Visit from 08/07/2021 in [facility] [place]  PHQ-9 Total Score 2       Fall Risk 07/05/2021 07/05/2021 07/06/2021 07/17/2021 08/07/2021  Falls in the past year? - - - 0 0  Was there an injury with Fall? - - - 0 0  Fall Risk Category Calculator - - - 0 0  Fall Risk Category - - - Low Low  Patient Fall Risk Level Low fall risk Low fall risk Low fall risk - -      Impression and Plan:  Welcome to Medicare preventive visit -Recommend routine eye and dental care. -Immunizations: PCV 20 administered today.  She will get shingles vaccination at pharmacy.  She declines flu and COVID vaccinations despite counseling. -Healthy lifestyle discussed in detail. -Labs to be updated today. -Colon cancer screening: 04/2015 -Breast cancer screening: Overdue, referral placed -Cervical cancer screening: 2021 -Lung cancer screening: Not applicable -Prostate cancer screening: Not applicable -DEXA: Ordered today.  Crohn's disease of both small and large intestine with intestinal obstruction ([place])  - Plan: CBC with Differential/Platelet, Comprehensive metabolic panel, Hemoglobin A1c, Lipid panel -Refer to GI.  Vitamin D deficiency  - Plan: VITAMIN D 25 Hydroxy (Vit-D Deficiency, Fractures)  Right foot drop  -Noted, wears an AFO brace.  Need for vaccination against Streptococcus pneumoniae -PCV 20 administered today.    Patient Instructions  -Nice seeing you today!!  -Lab work today; will notify you once results are  available.  -Pneumonia vaccine today.  -Consider your flu, COVID, shingles vaccines at the pharmacy.  -Schedule follow up in 6 months.      Lelon Frohlich, MD [facility]

## 2021-08-07 NOTE — Patient Instructions (Signed)
-  Nice seeing you today!!  -Lab work today; will notify you once results are available.  -Pneumonia vaccine today.  -Consider your flu, COVID, shingles vaccines at the pharmacy.  -Schedule follow up in 6 months.

## 2021-08-08 LAB — COMPREHENSIVE METABOLIC PANEL
ALT: 12 U/L (ref 0–35)
AST: 19 U/L (ref 0–37)
Albumin: 4.2 g/dL (ref 3.5–5.2)
Alkaline Phosphatase: 50 U/L (ref 39–117)
BUN: 12 mg/dL (ref 6–23)
CO2: 21 mEq/L (ref 19–32)
Calcium: 9.5 mg/dL (ref 8.4–10.5)
Chloride: 110 mEq/L (ref 96–112)
Creatinine, Ser: 0.47 mg/dL (ref 0.40–1.20)
GFR: 99.62 mL/min (ref >60.00)
Glucose, Bld: 68 mg/dL — ABNORMAL LOW (ref 70–99)
Potassium: 4 mEq/L (ref 3.5–5.1)
Sodium: 141 mEq/L (ref 135–145)
Total Bilirubin: 0.5 mg/dL (ref 0.2–1.2)
Total Protein: 7 g/dL (ref 6.0–8.3)

## 2021-08-08 LAB — LIPID PANEL
Cholesterol: 214 mg/dL — ABNORMAL HIGH (ref 0–200)
HDL: 94.1 mg/dL (ref >39.00)
LDL Cholesterol: 106 mg/dL — ABNORMAL HIGH (ref 0–99)
NonHDL: 119.58
Total CHOL/HDL Ratio: 2
Triglycerides: 69 mg/dL (ref 0.0–149.0)
VLDL: 13.8 mg/dL (ref 0.0–40.0)

## 2021-08-08 LAB — CBC WITH DIFFERENTIAL/PLATELET
Basophils Absolute: 0.1 10*3/uL (ref 0.0–0.1)
Basophils Relative: 1.3 % (ref 0.0–3.0)
Eosinophils Absolute: 0.1 10*3/uL (ref 0.0–0.7)
Eosinophils Relative: 1.6 % (ref 0.0–5.0)
HCT: 38.3 % (ref 36.0–46.0)
Hemoglobin: 12.9 g/dL (ref 12.0–15.0)
Lymphocytes Relative: 35.5 % (ref 12.0–46.0)
Lymphs Abs: 1.7 10*3/uL (ref 0.7–4.0)
MCHC: 33.7 g/dL (ref 30.0–36.0)
MCV: 96.9 fl (ref 78.0–100.0)
Monocytes Absolute: 0.4 10*3/uL (ref 0.1–1.0)
Monocytes Relative: 9 % (ref 3.0–12.0)
Neutro Abs: 2.6 10*3/uL (ref 1.4–7.7)
Neutrophils Relative %: 52.6 % (ref 43.0–77.0)
Platelets: 227 10*3/uL (ref 150.0–400.0)
RBC: 3.95 Mil/uL (ref 3.87–5.11)
RDW: 13.5 % (ref 11.5–15.5)
WBC: 4.8 10*3/uL (ref 4.0–10.5)

## 2021-08-08 LAB — TSH: TSH: 1.39 u[IU]/mL (ref 0.35–5.50)

## 2021-08-08 LAB — HEMOGLOBIN A1C: Hgb A1c MFr Bld: 5.2 % (ref 4.6–6.5)

## 2021-08-08 LAB — VITAMIN B12: Vitamin B-12: 226 pg/mL (ref 211–911)

## 2021-08-08 LAB — VITAMIN D 25 HYDROXY (VIT D DEFICIENCY, FRACTURES): VITD: 19.99 ng/mL — ABNORMAL LOW (ref 30.00–100.00)

## 2021-08-13 ENCOUNTER — Other Ambulatory Visit: Payer: Self-pay | Admitting: Internal Medicine

## 2021-08-13 DIAGNOSIS — E559 Vitamin D deficiency, unspecified: Secondary | ICD-10-CM

## 2021-08-13 MED ORDER — CYANOCOBALAMIN 1000 MCG/ML IJ SOLN: INTRAMUSCULAR | 1 refills | Status: DC

## 2021-08-13 MED ORDER — "BD SAFETYGLIDE SYRINGE/NEEDLE 25G X 1"" 3 ML MISC": 11 refills | Status: DC

## 2021-08-13 MED ORDER — VITAMIN D (ERGOCALCIFEROL) 1.25 MG (50000 UNIT) PO CAPS: 50000.0000 [IU] | ORAL_CAPSULE | ORAL | 0 refills | Status: DC

## 2021-08-21 ENCOUNTER — Telehealth: Payer: Self-pay | Admitting: Internal Medicine

## 2021-08-21 MED ORDER — CYANOCOBALAMIN 1000 MCG/ML IJ SOLN: INTRAMUSCULAR | 1 refills | Status: DC

## 2021-08-21 NOTE — Telephone Encounter (Signed)
Pt is calling and rx for b12 per pt  needs to be fax to [employer] at [phone] and [place] phone number is [phone]

## 2021-08-21 NOTE — Telephone Encounter (Signed)
Refill sent.

## 2021-08-27 ENCOUNTER — Telehealth: Payer: Self-pay | Admitting: Internal Medicine

## 2021-08-27 NOTE — Telephone Encounter (Signed)
Received fax from [place] to start PA for B12. Medicare part D does not cover, PA was denied.

## 2021-09-10 ENCOUNTER — Encounter: Payer: Self-pay | Admitting: Gastroenterology

## 2021-09-17 DIAGNOSIS — Z6828 Body mass index (BMI) 28.0-28.9, adult: Secondary | ICD-10-CM | POA: Diagnosis not present

## 2021-09-17 DIAGNOSIS — M459 Ankylosing spondylitis of unspecified sites in spine: Secondary | ICD-10-CM | POA: Diagnosis not present

## 2021-09-17 DIAGNOSIS — M2550 Pain in unspecified joint: Secondary | ICD-10-CM | POA: Diagnosis not present

## 2021-09-17 DIAGNOSIS — K50918 Crohn's disease, unspecified, with other complication: Secondary | ICD-10-CM | POA: Diagnosis not present

## 2021-09-17 DIAGNOSIS — E663 Overweight: Secondary | ICD-10-CM | POA: Diagnosis not present

## 2021-09-17 DIAGNOSIS — Z79899 Other long term (current) drug therapy: Secondary | ICD-10-CM | POA: Diagnosis not present

## 2021-10-22 ENCOUNTER — Ambulatory Visit: Payer: Medicare HMO | Admitting: Gastroenterology

## 2021-10-22 ENCOUNTER — Encounter: Payer: Self-pay | Admitting: Gastroenterology

## 2021-10-22 VITALS — BP 118/80 | HR 69 | Ht 61.0 in

## 2021-10-22 DIAGNOSIS — K50912 Crohn's disease, unspecified, with intestinal obstruction: Secondary | ICD-10-CM | POA: Diagnosis not present

## 2021-10-22 MED ORDER — NA SULFATE-K SULFATE-MG SULF 17.5-3.13-1.6 GM/177ML PO SOLN: 1.0000 | Freq: Once | ORAL | 0 refills | Status: AC

## 2021-10-22 NOTE — Progress Notes (Signed)
[facility] Consult Note:  History: [name] 10/22/2021  Referring provider: Isaac Bliss, Rayford Halsted, MD  Reason for consult/chief complaint: Crohn's Disease (Doing well) and Colonoscopy (Was seen at the ER for sbo, was told she needs colonoscopy and removal of scar tissue)   Subjective  HPI:  This is a very pleasant 66 year old woman referred to see [place] for evaluation of Crohn's disease.   was admitted to [facility] early November last year with a small bowel obstruction, she was seen in surgical consultation and it resolved with conservative therapy.  She was not seen in GI consultation, but was advised to see [place] as an outpatient for colonoscopy and evaluation of Crohn's disease activity as well as follow-up with surgery for consideration of resection.  No records of her prior GI care are available.  She recalls being diagnosed with Crohn's disease about 30 years ago by Dr. Timmothy Euler with the [facility] practice, and was cared for him until his retirement.  She would get periodic prednisone in the few years after the surgery is but she can recall, but has otherwise not been on any Crohn's specific treatment over the years.  She believes she had a colonoscopy 5 to 6 years ago but cannot recall who may have done that.  While the report is not available, hospital encounters appear to show a visit with Dr. Earle Gell for colonoscopy in August 2016.  She recalls being told things were "good", and she did not need another colonoscopy for 10 years.  However, discharge medication list from that encounter shows that she was given a tapering course of prednisone afterward.  Her typical bowel pattern was semiformed to formed stool every 1 to 2 days, and since the hospital stay has had 1-2 loose nonbloody stools per day. She denies nausea, vomiting, dysphagia, early satiety or weight loss.  Oliwia has not been to see the surgical practice since the  hospital discharge, as she is uncertain she wants to undergo any surgery.  Her understanding is that if surgery was to be performed, it would be with a suspicion of adhesions as a cause for the recent bowel obstruction. She was hospitalized 5 or 6 years before that, and says that in between those episodes she will get occasional brief episodes of abdominal pain but go on liquids for 2 to 3 days and otherwise feel well.  Therefore, she is not certain that the degree and severity of this requires surgery.  ROS:  Review of Systems  Constitutional:  Negative for appetite change and unexpected weight change.  HENT:  Negative for mouth sores and voice change.   Eyes:  Negative for pain and redness.  Respiratory:  Negative for cough and shortness of breath.   Cardiovascular:  Negative for chest pain and palpitations.  Genitourinary:  Negative for dysuria and hematuria.  Musculoskeletal:  Positive for arthralgias and back pain. Negative for myalgias.  Skin:  Negative for pallor and rash.  Neurological:  Negative for weakness and headaches.  Hematological:  Negative for adenopathy.   Arthralgias and chronic mid to lower back pain (see orthopedic surgical history below)  Past Medical History: Past Medical History:  Diagnosis Date   Arthritis    Crohn disease ([place]) 07/04/2021   Crohn's disease ([place])    03-26-15 Prednisone sarted 2 weeks ago   Foot drop    right foot-wears brace.   Migraine    On prednisone therapy    started 2 weeks ago  Pancreatitis    Ankylosing spondylitis  Past Surgical History: Past Surgical History:  Procedure Laterality Date   BOWEL RESECTION     Crohn's disease   COLONOSCOPY WITH PROPOFOL N/A 04/02/2015   Procedure: COLONOSCOPY WITH PROPOFOL;  Surgeon: Garlan Fair, MD;  Location: [facility];  Service: Endoscopy;  Laterality: N/A;   hip replacement x 5      KNEE SURGERY     scopes   SHOULDER SURGERY     debridement     Family History: Family History   Problem Relation Age of Onset   Stroke Mother    Diabetes Mother    COPD Father    Arthritis Father     Social History: Social History   Socioeconomic History   Marital status: Married    Spouse name: Not on file   Number of children: Not on file   Years of education: Not on file   Highest education level: Not on file  Occupational History   Not on file  Tobacco Use   Smoking status: Never   Smokeless tobacco: Never  Substance and Sexual Activity   Alcohol use: Yes    Comment: social    Drug use: No   Sexual activity: Not on file  Other Topics Concern   Not on file  Social History Narrative   Not on file   Social Determinants of Health   Financial Resource Strain: Not on file  Food Insecurity: Not on file  Transportation Needs: Not on file  Physical Activity: Not on file  Stress: Not on file  Social Connections: Not on file    Allergies: No Known Allergies  Outpatient Meds: Current Outpatient Medications  Medication Sig Dispense Refill   cyanocobalamin (,VITAMIN B-12,) 1000 MCG/ML injection Inject 9ml in deltoid once weekly for 4 weeks, then inject 1 ml once a month thereafter 6 mL 1   meclizine (ANTIVERT) 12.5 MG tablet Take 12.5 mg by mouth 3 (three) times daily as needed for dizziness.     oxyCODONE-acetaminophen (PERCOCET) 5-325 MG per tablet Take 1-2 tablets by mouth every 6 (six) hours as needed for severe pain. 20 tablet 0   promethazine (PHENERGAN) 12.5 MG tablet Take 12.5 mg by mouth every 6 (six) hours as needed for nausea or vomiting.     SYRINGE-NEEDLE, DISP, 3 ML (BD SAFETYGLIDE SYRINGE/NEEDLE) 25G X 1" 3 ML MISC Use for B12 injections 100 each 11   topiramate (TOPAMAX) 200 MG tablet Take 300 mg by mouth at bedtime.     Vitamin D, Ergocalciferol, (DRISDOL) 1.25 MG (50000 UNIT) CAPS capsule Take 1 capsule (50,000 Units total) by mouth every 7 (seven) days for 12 doses. 12 capsule 0   ZEMBRACE SYMTOUCH 3 MG/0.5ML SOAJ Inject 1 Dose into the skin  daily as needed for migraine.     No current facility-administered medications for this visit.      ___________________________________________________________________ Objective   Exam:  BP 118/80    Pulse 69    Ht 5\' 1"  (1.549 m)    SpO2 96%    BMI 28.06 kg/m  Wt Readings from Last 3 Encounters:  08/07/21 148 lb 8 oz (67.4 kg)  07/17/21 145 lb 8 oz (66 kg)  07/04/21 148 lb 5.9 oz (67.3 kg)    General: Well-appearing, antalgic gait, gets on exam table independently Eyes: sclera anicteric, no redness ENT: oral mucosa moist without lesions, no cervical or supraclavicular lymphadenopathy CV: RRR without murmur, S1/S2, no JVD, no peripheral edema Resp: clear to  auscultation bilaterally, normal RR and effort noted GI: soft, no tenderness, with active bowel sounds. No guarding or palpable organomegaly noted.  Long midline scar without hernia Skin; warm and dry, no rash or jaundice noted Neuro: awake, alert and oriented x 3. Normal gross motor function and fluent speech  Labs:  CBC Latest Ref Rng & Units 08/07/2021 07/17/2021 07/06/2021  WBC 4.0 - 10.5 K/uL 4.8 5.0 4.6  Hemoglobin 12.0 - 15.0 g/dL 12.9 13.3 11.9(L)  Hematocrit 36.0 - 46.0 % 38.3 40.0 35.6(L)  Platelets 150.0 - 400.0 K/uL 227.0 240.0 216   CMP Latest Ref Rng & Units 08/07/2021 07/17/2021 07/06/2021  Glucose 70 - 99 mg/dL 68(L) 69(L) 103(H)  BUN 6 - 23 mg/dL 12 12 <5(L)  Creatinine 0.40 - 1.20 mg/dL 0.47 0.46 <0.30(L)  Sodium 135 - 145 mEq/L 141 140 142  Potassium 3.5 - 5.1 mEq/L 4.0 3.5 3.2(L)  Chloride 96 - 112 mEq/L 110 110 111  CO2 19 - 32 mEq/L 21 23 24   Calcium 8.4 - 10.5 mg/dL 9.5 9.2 8.9  Total Protein 6.0 - 8.3 g/dL 7.0 7.0 6.4(L)  Total Bilirubin 0.2 - 1.2 mg/dL 0.5 0.4 0.7  Alkaline Phos 39 - 117 U/L 50 60 52  AST 0 - 37 U/L 19 14 19   ALT 0 - 35 U/L 12 10 13      Radiologic Studies:  CLINICAL DATA:  Evaluate for bowel obstruction.   EXAM: CT ABDOMEN AND PELVIS WITH CONTRAST    TECHNIQUE: Multidetector CT imaging of the abdomen and pelvis was performed using the standard protocol following bolus administration of intravenous contrast.   CONTRAST:  38mL OMNIPAQUE IOHEXOL 350 MG/ML SOLN   COMPARISON:  03/10/2015   FINDINGS: Lower chest: No acute abnormality.   Hepatobiliary: No focal liver abnormality. Gallbladder appears normal. Mild intrahepatic biliary dilatation. The common bile duct measures 7 mm in maximum diameter. No obstructing mass or signs of choledocholithiasis by CT.   Pancreas: Unremarkable. No pancreatic ductal dilatation or surrounding inflammatory changes.   Spleen: Normal in size without focal abnormality.   Adrenals/Urinary Tract: Normal adrenal glands. Exophytic cyst arising off inferior pole of the left kidney measures 1.1 cm, image 36/2. No hydronephrosis. Evaluation of the bladder in surrounding pelvic organs is significantly diminished due to streak artifact from bilateral hip arthroplasty devices.   Stomach/Bowel: Stomach is normal. Proximal small bowel loops are unremarkable. Signs of previous right hemicolectomy with enterocolonic anastomosis. Increase caliber of the distal small bowel loops with surrounding soft tissue stranding and fluid is noted in the right hemiabdomen. The dilated small bowel loops measure 3.3 cm in diameter up to the level of the enterocolonic anastomosis, image 41/5. The enterocolonic anastomosis appears patent. However, there is wall thickening involving the proximal colon at the level of the anastomosis. Additionally, just beyond the anastomosis is a focal area of luminal narrowing involving the proximal transverse colon, image 36/5 and image 38/2. This is concerning for post anastomotic stricture. The remaining portions of the colon are unremarkable. Assessment of the sigmoid colon and rectum is markedly diminished due to beam hardening artifact from patient's bilateral hip prostheses.    Vascular/Lymphatic: No significant vascular findings are present. No enlarged abdominal or pelvic lymph nodes.   Reproductive: Evaluation of the reproductive organs is markedly diminished due to beam hardening artifact from bilateral hip prostheses. Simple appearing cyst within the left ovary measures 2.2, image 59/2.   Other: Mild fluid is noted within the right abdomen at the level of the enterocolonic  anastomosis.   Musculoskeletal: Status post bilateral hip arthroplasty.   IMPRESSION: 1. Signs of previous right hemicolectomy with enterocolonic anastomosis. Increase caliber of the distal small bowel loops with surrounding soft tissue stranding and fluid is noted in the right hemiabdomen. Although the anastomosis appears patent there is wall thickening involving the proximal colon at the level of the anastomosis. Additionally, just beyond the anastomosis is a focal area of luminal narrowing involving the proximal transverse colon. This is concerning for post anastomotic stricture resulting in distal small bowel obstruction. 2. Mild intrahepatic biliary dilatation and common bile duct dilatation. No obstructing mass or signs of choledocholithiasis by CT. 3. Left ovary cyst measures 2.2 cm.  No follow-up imaging indicated.     Electronically Signed   By: Kerby Moors M.D.   On: 07/04/2021 12:17 _________________________________  July 2016 CT abdomen and pelvis report  Narrative & Impression CLINICAL DATA:  Acute onset of right lower quadrant pain and swelling this morning. Nausea. Crohn's disease. Personal history of pancreatitis.   EXAM: CT ABDOMEN AND PELVIS WITH CONTRAST   TECHNIQUE: Multidetector CT imaging of the abdomen and pelvis was performed using the standard protocol following bolus administration of intravenous contrast.   CONTRAST:  28mL OMNIPAQUE IOHEXOL 300 MG/ML  SOLN   COMPARISON:  02/06/2015   FINDINGS: Lower Chest: No acute findings.    Hepatobiliary: No masses or other significant abnormality identified. Gallbladder is unremarkable.   Pancreas: No mass, inflammatory changes, or other significant abnormality identified.   Spleen:  Within normal limits in size and appearance.   Adrenals:  No masses identified.   Kidneys/Urinary Tract: No evidence of masses or hydronephrosis. Tiny benign-appearing Bosniak category 1 and 2 left renal cysts again noted.   Stomach/Bowel/Peritoneum: Primary ileocolic anastomosis again noted. Mild wall thickening and enhancement is seen involving the distal ileum with mild adjacent mesenteric inflammatory changes, without significant change since previous study. This is consistent with active Crohn's disease. There is no evidence of abscess, fistula, or free air. No evidence of bowel obstruction. Small amount of free fluid again seen within the pelvic cul-de-sac.   Vascular/Lymphatic: No pathologically enlarged lymph nodes identified. No abdominal aortic aneurysm or other significant retroperitoneal abnormality demonstrated.   Reproductive:  No mass or other significant abnormality identified.   Other: Bilateral hip prostheses result in significant beam hardening artifact obscuring visualization of the lower pelvis.   Musculoskeletal:  No suspicious bone lesions identified.   IMPRESSION: Mild active Crohn's disease involving the distal ileum, without significant change compared to previous study. No evidence of abscess, bowel obstruction, or other complication.     Electronically Signed   By: Earle Gell M.D.   On: 03/10/2015 10:59   Assessment: Encounter Diagnosis  Name Primary?   Crohn's disease with intestinal obstruction, unspecified gastrointestinal tract location [facility]) Yes    Crohn's disease diagnosed nearly 30 years ago requiring a right hemicolectomy at least 20 years ago, she has been on no chronic Crohn's disease therapy since then.  Activity at the time of  last colonoscopy unknown, but I suspect there was some activity since she was prescribed prednisone afterward.  She generally feels well, and seems less concerned about the bowel obstruction then the surgical colleagues have been, particular because she has felt generally well over the years.  Nevertheless, she may have chronic smoldering Crohn's inflammation requiring treatment. She does not reportedly have ankylosing spondylitis, which has an association with active Crohn's disease.  Plan:  Colonoscopy to assess Crohn's activity.  She was agreeable after discussion of procedure and risks.  The benefits and risks of the planned procedure were described in detail with the patient or (when appropriate) their health care proxy.  Risks were outlined as including, but not limited to, bleeding, infection, perforation, adverse medication reaction leading to cardiac or pulmonary decompensation, pancreatitis (if ERCP).  The limitation of incomplete mucosal visualization was also discussed.  No guarantees or warranties were given.  Afterward, we can then have a further discussion about whether or not any Crohn's therapy is required, and then she can make a more competent decision about consulting with surgery.  Thank you for the courtesy of this consult.  Please call me with any questions or concerns.  Nelida Meuse III  CC: Referring provider noted above

## 2021-10-22 NOTE — Patient Instructions (Signed)
You have been scheduled for a colonoscopy. Please follow written instructions given to you at your visit today.  Please pick up your prep supplies at the pharmacy within the next 1-3 days. If you use inhalers (even only as needed), please bring them with you on the day of your procedure.  If you are age 66 or older, your body mass index should be between 23-30. Your Body mass index is 28.06 kg/m. If this is out of the aforementioned range listed, please consider follow up with your Primary Care Provider.  If you are age 40 or younger, your body mass index should be between 19-25. Your Body mass index is 28.06 kg/m. If this is out of the aformentioned range listed, please consider follow up with your Primary Care Provider.   ________________________________________________________  The [place] [facility] providers would like to encourage you to use [facility] to communicate with providers for non-urgent requests or questions.  Due to long hold times on the telephone, sending your provider a message by [facility] may be a faster and more efficient way to get a response.  Please allow 48 business hours for a response.  Please remember that this is for non-urgent requests.  _______________________________________________________

## 2021-10-28 DIAGNOSIS — H61001 Unspecified perichondritis of right external ear: Secondary | ICD-10-CM | POA: Diagnosis not present

## 2021-10-28 DIAGNOSIS — L570 Actinic keratosis: Secondary | ICD-10-CM | POA: Diagnosis not present

## 2021-11-05 ENCOUNTER — Other Ambulatory Visit: Payer: Self-pay | Admitting: Internal Medicine

## 2021-11-05 DIAGNOSIS — E559 Vitamin D deficiency, unspecified: Secondary | ICD-10-CM

## 2021-11-18 DIAGNOSIS — E663 Overweight: Secondary | ICD-10-CM | POA: Diagnosis not present

## 2021-11-18 DIAGNOSIS — Z6828 Body mass index (BMI) 28.0-28.9, adult: Secondary | ICD-10-CM | POA: Diagnosis not present

## 2021-11-18 DIAGNOSIS — Z79899 Other long term (current) drug therapy: Secondary | ICD-10-CM | POA: Diagnosis not present

## 2021-11-18 DIAGNOSIS — K50918 Crohn's disease, unspecified, with other complication: Secondary | ICD-10-CM | POA: Diagnosis not present

## 2021-11-18 DIAGNOSIS — G894 Chronic pain syndrome: Secondary | ICD-10-CM | POA: Diagnosis not present

## 2021-11-18 DIAGNOSIS — M459 Ankylosing spondylitis of unspecified sites in spine: Secondary | ICD-10-CM | POA: Diagnosis not present

## 2021-11-20 ENCOUNTER — Encounter: Payer: Self-pay | Admitting: Gastroenterology

## 2021-11-24 ENCOUNTER — Telehealth: Payer: Self-pay | Admitting: Gastroenterology

## 2021-11-24 DIAGNOSIS — K50812 Crohn's disease of both small and large intestine with intestinal obstruction: Secondary | ICD-10-CM | POA: Diagnosis not present

## 2021-11-24 MED ORDER — OMEPRAZOLE 20 MG PO CPDR: 20.0000 mg | DELAYED_RELEASE_CAPSULE | Freq: Two times a day (BID) | ORAL | 0 refills | Status: DC

## 2021-11-24 NOTE — Telephone Encounter (Signed)
Returned call to patient. She reports that she has been having a burning pain in stomach. Pt reports that this has been going on for about 2 weeks. Pt denies any history of reflux. She states that she has not eaten anything spicy, acidic, citrus, or heavily seasoned foods. She does report that she drinks coffee and has an occasional soda. Pt reports that she has tried Tums and it helps some but does not resolve her symptoms. Pt reports that the burning stops after her food seems to have digested. Pt wanted to know if you think she should have an EGD and colon. She is willing to push back her appt to have procedures on the same day if that is what you recommend. I told to that if this is a new symptoms you may want to see her back in the office but I would ask. She knows that there is no availability to add on EGD to her current appt. Please advise, thanks.    ?

## 2021-11-24 NOTE — Telephone Encounter (Signed)
Patient called states she is experiencing a lot of burning in her throat every time she eats and would like to get some advise. She also questioned about having an EGD together with the colonoscopy. I advised her she would need to follow up with a provider OV first. ?

## 2021-11-24 NOTE — Telephone Encounter (Signed)
Called and spoke with patient regarding Dr. Loletha Carrow' recommendations. Pt will being taking Omeprazole 20 mg BID AC. Pt states that she will keep her colonoscopy as scheduled this week. Pt will try OTC Omeprazole for a couple of weeks and will call back if no improvement in symptoms. Pt verbalized understanding and had no concerns at the end of the call. ?

## 2021-11-24 NOTE — Telephone Encounter (Signed)
Thank you for the note. ? ?With recent onset symptoms as described, it is not yet clear if an upper endoscopy is necessary at this juncture. ?It sounds like she is having gastroesophageal reflux. ? ?I recommend she get generic omeprazole, take one tablet twice a day just before breakfast and supper starting today. ?Her Crohn's disease needs colonoscopic evaluation, and my advice is to have the colonoscopy as scheduled this week. ? ?If she would prefer not to do so, and would like to make an office appointment at a later date, that is fine with me. ? ?HD ?

## 2021-11-26 ENCOUNTER — Ambulatory Visit (AMBULATORY_SURGERY_CENTER): Payer: Medicare HMO | Admitting: Gastroenterology

## 2021-11-26 ENCOUNTER — Other Ambulatory Visit: Payer: Self-pay

## 2021-11-26 ENCOUNTER — Encounter: Payer: Self-pay | Admitting: Gastroenterology

## 2021-11-26 VITALS — BP 122/62 | HR 75 | Temp 98.0°F | Resp 12 | Ht 61.0 in | Wt 148.0 lb

## 2021-11-26 DIAGNOSIS — K50812 Crohn's disease of both small and large intestine with intestinal obstruction: Secondary | ICD-10-CM

## 2021-11-26 DIAGNOSIS — K9189 Other postprocedural complications and disorders of digestive system: Secondary | ICD-10-CM | POA: Diagnosis not present

## 2021-11-26 MED ORDER — SODIUM CHLORIDE 0.9 % IV SOLN: 500.0000 mL | Freq: Once | INTRAVENOUS | Status: DC

## 2021-11-26 NOTE — Progress Notes (Signed)
History and Physical: ? This patient presents for endoscopic testing for: ?Encounter Diagnosis  ?Name Primary?  ? Crohn's disease of both small and large intestine with intestinal obstruction ([place]) Yes  ? ? ?Clinical details in [place] office note 10/22/21 ?No changes since then. ? ?ROS: ?Patient denies chest pain or shortness of breath ? ? ?Past Medical History: ?Past Medical History:  ?Diagnosis Date  ? Arthritis   ? Crohn disease ([place]) 07/04/2021  ? Crohn's disease ([place])   ? 03-26-15 Prednisone sarted 2 weeks ago  ? Foot drop   ? right foot-wears brace.  ? Migraine   ? On prednisone therapy   ? started 2 weeks ago  ? Pancreatitis   ? ? ? ?Past Surgical History: ?Past Surgical History:  ?Procedure Laterality Date  ? BOWEL RESECTION    ? Crohn's disease  ? COLONOSCOPY    ? COLONOSCOPY WITH PROPOFOL N/A 04/02/2015  ? Procedure: COLONOSCOPY WITH PROPOFOL;  Surgeon: Garlan Fair, MD;  Location: [facility];  Service: Endoscopy;  Laterality: N/A;  ? hip replacement x 5     ? KNEE SURGERY    ? scopes  ? SHOULDER SURGERY    ? debridement  ? ? ?Allergies: ?No Known Allergies ? ?Outpatient Meds: ?Current Outpatient Medications  ?Medication Sig Dispense Refill  ? topiramate (TOPAMAX) 200 MG tablet Take 300 mg by mouth at bedtime.    ? cyanocobalamin (,VITAMIN B-12,) 1000 MCG/ML injection Inject 56m in deltoid once weekly for 4 weeks, then inject 1 ml once a month thereafter 6 mL 1  ? meclizine (ANTIVERT) 12.5 MG tablet Take 12.5 mg by mouth 3 (three) times daily as needed for dizziness.    ? omeprazole (PRILOSEC) 20 MG capsule Take 1 capsule (20 mg total) by mouth 2 (two) times daily before a meal. 60 capsule 0  ? oxyCODONE-acetaminophen (PERCOCET) 5-325 MG per tablet Take 1-2 tablets by mouth every 6 (six) hours as needed for severe pain. 20 tablet 0  ? promethazine (PHENERGAN) 12.5 MG tablet Take 12.5 mg by mouth every 6 (six) hours as needed for nausea or vomiting.    ? SYRINGE-NEEDLE, DISP, 3 ML (BD SAFETYGLIDE  SYRINGE/NEEDLE) 25G X 1" 3 ML MISC Use for B12 injections 100 each 11  ? ZEMBRACE SYMTOUCH 3 MG/0.5ML SOAJ Inject 1 Dose into the skin daily as needed for migraine.    ? ?Current Facility-Administered Medications  ?Medication Dose Route Frequency Provider Last Rate Last Admin  ? 0.9 %  sodium chloride infusion  500 mL Intravenous Once DDoran Stabler MD      ? ? ? ? ?___________________________________________________________________ ?Objective  ? ?Exam: ? ?BP 139/79   Pulse 73   Temp 98 ?F (36.7 ?C) (Temporal)   Ht '5\' 1"'$  (1.549 m)   Wt 148 lb (67.1 kg)   SpO2 99%   BMI 27.96 kg/m?  ? ?CV: RRR without murmur, S1/S2 ?Resp: clear to auscultation bilaterally, normal RR and effort noted ?GI: soft, no tenderness, with active bowel sounds. ? ? ?Assessment: ?Encounter Diagnosis  ?Name Primary?  ? Crohn's disease of both small and large intestine with intestinal obstruction ([place]  ? ? ? ?Plan: ?Colonoscopy ? ? ?The patient is appropriate for an endoscopic procedure in the ambulatory setting. ? ? - HWilfrid Lund MD ? ? ? ? ?

## 2021-11-26 NOTE — Progress Notes (Signed)
Report given to PACU, vss 

## 2021-11-26 NOTE — Patient Instructions (Signed)
Await pathology results   YOU HAD AN ENDOSCOPIC PROCEDURE TODAY AT THE [facility]:   Refer to the procedure report that was given to you for any specific questions about what was found during the examination.  If the procedure report does not answer your questions, please call your gastroenterologist to clarify.  If you requested that your care partner not be given the details of your procedure findings, then the procedure report has been included in a sealed envelope for you to review at your convenience later.  YOU SHOULD EXPECT: Some feelings of bloating in the abdomen. Passage of more gas than usual.  Walking can help get rid of the air that was put into your GI tract during the procedure and reduce the bloating. If you had a lower endoscopy (such as a colonoscopy or flexible sigmoidoscopy) you may notice spotting of blood in your stool or on the toilet paper. If you underwent a bowel prep for your procedure, you may not have a normal bowel movement for a few days.  Please Note:  You might notice some irritation and congestion in your nose or some drainage.  This is from the oxygen used during your procedure.  There is no need for concern and it should clear up in a day or so.  SYMPTOMS TO REPORT IMMEDIATELY:   Following lower endoscopy (colonoscopy or flexible sigmoidoscopy):  Excessive amounts of blood in the stool  Significant tenderness or worsening of abdominal pains  Swelling of the abdomen that is new, acute  Fever of 100F or higher  For urgent or emergent issues, a gastroenterologist can be reached at any hour by calling [phone]. Do not use MyChart messaging for urgent concerns.    DIET:  We do recommend a small meal at first, but then you may proceed to your regular diet.  Drink plenty of fluids but you should avoid alcoholic beverages for 24 hours.  ACTIVITY:  You should plan to take it easy for the rest of today and you should NOT DRIVE or use heavy  machinery until tomorrow (because of the sedation medicines used during the test).    FOLLOW UP: Our staff will call the number listed on your records 48-72 hours following your procedure to check on you and address any questions or concerns that you may have regarding the information given to you following your procedure. If we do not reach you, we will leave a message.  We will attempt to reach you two times.  During this call, we will ask if you have developed any symptoms of COVID 19. If you develop any symptoms (ie: fever, flu-like symptoms, shortness of breath, cough etc.) before then, please call [phone].  If you test positive for Covid 19 in the 2 weeks post procedure, please call and report this information to us.    If any biopsies were taken you will be contacted by phone or by letter within the next 1-3 weeks.  Please call us at [phone] if you have not heard about the biopsies in 3 weeks.    SIGNATURES/CONFIDENTIALITY: You and/or your care partner have signed paperwork which will be entered into your electronic medical record.  These signatures attest to the fact that that the information above on your After Visit Summary has been reviewed and is understood.  Full responsibility of the confidentiality of this discharge information lies with you and/or your care-partner. 

## 2021-11-26 NOTE — Progress Notes (Signed)
VS completed by DT.  Pt's states no medical or surgical changes since previsit or office visit.  

## 2021-11-26 NOTE — Progress Notes (Signed)
Called to room to assist during endoscopic procedure.  Patient ID and intended procedure confirmed with present staff. Received instructions for my participation in the procedure from the performing physician.  

## 2021-11-26 NOTE — Op Note (Signed)
[place] ?Patient Name: [name] ?Procedure Date: 11/26/2021 12:03 PM ?MRN: [id number] ?Endoscopist: Estill Cotta. Loletha Carrow , MD ?Age: 66 ?Referring MD:  ?Date of Birth: [birth year] ?Gender: Female ?Account #: [id number] ?Procedure:                Colonoscopy ?Indications:              Disease activity assessment of Crohn's disease of  ?                          the small bowel and colon ?                          see recent office consult note for clinical details ?                          CTAP during hospitlization for SBO Nov 2022  ?                          described dilated and inflamed distal  ?                          ileum/anastomotic area as well as probable TC  ?                          stricture ?Medicines:                Monitored Anesthesia Care ?Procedure:                Pre-Anesthesia Assessment: ?                          - Prior to the procedure, a History and Physical  ?                          was performed, and patient medications and  ?                          allergies were reviewed. The patient's tolerance of  ?                          previous anesthesia was also reviewed. The risks  ?                          and benefits of the procedure and the sedation  ?                          options and risks were discussed with the patient.  ?                          All questions were answered, and informed consent  ?                          was obtained. Prior Anticoagulants: The patient has  ?  taken no previous anticoagulant or antiplatelet  ?                          agents. ASA Grade Assessment: II - A patient with  ?                          mild systemic disease. After reviewing the risks  ?                          and benefits, the patient was deemed in  ?                          satisfactory condition to undergo the procedure. ?                          After obtaining informed consent, the colonoscope  ?                          was passed under  direct vision. Throughout the  ?                          procedure, the patient's blood pressure, pulse, and  ?                          oxygen saturations were monitored continuously. The  ?                          Olympus PCF-H190DL (#[id number]) Colonoscope was  ?                          introduced through the anus and advanced to the the  ?                          ileocolonic anastomosis. The colonoscopy was  ?                          performed without difficulty. The patient tolerated  ?                          the procedure well. The quality of the bowel  ?                          preparation was good. The neo-terminal ileum, the  ?                          rectum and ileo-colonic anastomosis were  ?                          photographed. ?Scope In: 97:41:63 PM ?Scope Out: 12:30:32 PM ?Scope Withdrawal Time: 0 hours 10 minutes 55 seconds  ?Total Procedure Duration: 0 hours 14 minutes 18 seconds  ?Findings:                 The perianal and digital rectal examinations were  ?  normal. Specifically, no active peri-anal Crohn's  ?                          disease seen. ?                          There was evidence of a prior end-to-side  ?                          ileo-colonic anastomosis in the distal ascending  ?                          colon. This was patent and was characterized by  ?                          edema, erythema, friable mucosa and ulceration. The  ?                          anastomosis was not traversed. Biopsies were taken  ?                          with a cold forceps for histology. The visualized  ?                          neo-TI (seen with scope against anastomosis and  ?                          photographed) was normal. This coincides with the  ?                          possible TC stricture described on last CT scan. ?                          An area of scarred mucosa was found in the  ?                          transverse colon (see photos - linear  scarring with  ?                          tethered mucosal folds). This causes mild  ?                          tortuosity but little lumenal narrowing. The mucosa  ?                          in this area was otherwise normal and not inflamed. ?                          Internal hemorrhoids were found. ?                          The exam was otherwise without abnormality on  ?  direct and retroflexion views. ?Complications:            No immediate complications. ?Estimated Blood Loss:     Estimated blood loss was minimal. ?Impression:               - Patent end-to-side ileo-colonic anastomosis,  ?                          characterized by edema, erythema, friable mucosa  ?                          and ulceration. Biopsied. ?                          - Scarred mucosa in the transverse colon. ?                          - Internal hemorrhoids. ?                          - The examination was otherwise normal on direct  ?                          and retroflexion views. ?Recommendation:           - Patient has a contact number available for  ?                          emergencies. The signs and symptoms of potential  ?                          delayed complications were discussed with the  ?                          patient. Return to normal activities tomorrow.  ?                          Written discharge instructions were provided to the  ?                          patient. ?                          - Resume previous diet. ?                          - Continue present medications. ?                          - Await pathology results. ?                          - Return to my office at appointment to be  ?                          scheduled. Reconsider willingness to be on chronic  ?  Crohn's treatment. CT enterography when not having  ?                          an SBO would help guide therapy. ?Anabelle Bungert L. Loletha Carrow, MD ?11/26/2021 12:43:03 PM ?This report has been signed  electronically. ?

## 2021-11-27 ENCOUNTER — Telehealth: Payer: Self-pay

## 2021-11-27 NOTE — Telephone Encounter (Signed)
Per 11/26/21 procedure report: ?- Return to my office at appt to be scheduled. Reconsider willingness to be on chronic Crohn's treatment. CT enterography when not having an SBO would help guide therapy.  ? ?Called and spoke with patient. She has been scheduled for a follow up appt with Dr. Loletha Carrow on Monday, 12/29/21 at 2 pm. Pt verbalized understanding and had no concerns at the end of the call. ?

## 2021-11-28 ENCOUNTER — Telehealth: Payer: Self-pay

## 2021-11-28 ENCOUNTER — Telehealth: Payer: Self-pay | Admitting: *Deleted

## 2021-11-28 NOTE — Telephone Encounter (Signed)
?  Follow up Call- ? ? ?  11/26/2021  ? 11:10 AM  ?Call back number  ?Post procedure Call Back phone  # [phone]  ?Permission to leave phone message Yes  ?  ? ?Patient questions: ? ?Do you have a fever, pain , or abdominal swelling? No. ?Pain Score  0 * ? ?Have you tolerated food without any problems? Yes.   ? ?Have you been able to return to your normal activities? Yes.   ? ?Do you have any questions about your discharge instructions: ?Diet   No. ?Medications  No. ?Follow up visit  No. ? ?Do you have questions or concerns about your Care? No. ? ?Actions: ?* If pain score is 4 or above: ?No action needed, pain <4. ? ? ?

## 2021-11-28 NOTE — Telephone Encounter (Signed)
Follow up call placed, VM obtained and message left. ?SChaplin, RN,BSN ? ?

## 2021-12-03 ENCOUNTER — Other Ambulatory Visit: Payer: Self-pay

## 2021-12-03 DIAGNOSIS — K50812 Crohn's disease of both small and large intestine with intestinal obstruction: Secondary | ICD-10-CM

## 2021-12-08 ENCOUNTER — Telehealth: Payer: Self-pay | Admitting: Gastroenterology

## 2021-12-08 NOTE — Telephone Encounter (Signed)
Called pt and gave pt number to reschedule CT scan. Pt verbalized understanding and had no other concerns at end of call.  ?

## 2021-12-08 NOTE — Telephone Encounter (Signed)
Inbound call from patient stating that she needs to reschedule her CT scan on 4/17. Please advise.  ?

## 2021-12-15 ENCOUNTER — Other Ambulatory Visit (HOSPITAL_BASED_OUTPATIENT_CLINIC_OR_DEPARTMENT_OTHER): Payer: Medicare HMO

## 2021-12-23 DIAGNOSIS — H524 Presbyopia: Secondary | ICD-10-CM | POA: Diagnosis not present

## 2021-12-23 DIAGNOSIS — H2513 Age-related nuclear cataract, bilateral: Secondary | ICD-10-CM | POA: Diagnosis not present

## 2021-12-23 DIAGNOSIS — H35372 Puckering of macula, left eye: Secondary | ICD-10-CM | POA: Diagnosis not present

## 2021-12-23 DIAGNOSIS — H5203 Hypermetropia, bilateral: Secondary | ICD-10-CM | POA: Diagnosis not present

## 2021-12-23 DIAGNOSIS — H52223 Regular astigmatism, bilateral: Secondary | ICD-10-CM | POA: Diagnosis not present

## 2021-12-23 DIAGNOSIS — Z961 Presence of intraocular lens: Secondary | ICD-10-CM | POA: Diagnosis not present

## 2021-12-23 DIAGNOSIS — H02403 Unspecified ptosis of bilateral eyelids: Secondary | ICD-10-CM | POA: Diagnosis not present

## 2021-12-29 ENCOUNTER — Ambulatory Visit: Payer: Medicare HMO | Admitting: Gastroenterology

## 2021-12-30 ENCOUNTER — Encounter (HOSPITAL_COMMUNITY): Payer: Self-pay

## 2021-12-30 ENCOUNTER — Ambulatory Visit (HOSPITAL_COMMUNITY)
Admission: RE | Admit: 2021-12-30 | Discharge: 2021-12-30 | Disposition: A | Discharge status: Home / Self Care | Payer: Medicare HMO | Source: Ambulatory Visit | Attending: Gastroenterology | Admitting: Gastroenterology

## 2021-12-30 DIAGNOSIS — K6389 Other specified diseases of intestine: Secondary | ICD-10-CM | POA: Diagnosis not present

## 2021-12-30 DIAGNOSIS — K50812 Crohn's disease of both small and large intestine with intestinal obstruction: Secondary | ICD-10-CM | POA: Insufficient documentation

## 2021-12-30 DIAGNOSIS — K3189 Other diseases of stomach and duodenum: Secondary | ICD-10-CM | POA: Diagnosis not present

## 2021-12-30 DIAGNOSIS — K5090 Crohn's disease, unspecified, without complications: Secondary | ICD-10-CM | POA: Diagnosis not present

## 2021-12-30 DIAGNOSIS — N83202 Unspecified ovarian cyst, left side: Secondary | ICD-10-CM | POA: Diagnosis not present

## 2021-12-30 MED ORDER — SODIUM CHLORIDE (PF) 0.9 % IJ SOLN
INTRAMUSCULAR | Status: AC
  Filled 2021-12-30: qty 50

## 2021-12-30 MED ORDER — BARIUM SULFATE 0.1 % PO SUSP
ORAL | Status: AC
  Filled 2021-12-30: qty 3

## 2021-12-30 MED ORDER — IOHEXOL 300 MG/ML  SOLN
100.0000 mL | Freq: Once | INTRAMUSCULAR | Status: AC | PRN
  Administered 2021-12-30: 100 mL via INTRAVENOUS

## 2022-01-20 ENCOUNTER — Ambulatory Visit
Admission: RE | Admit: 2022-01-20 | Discharge: 2022-01-20 | Disposition: A | Discharge status: Home / Self Care | Payer: Medicare HMO | Source: Ambulatory Visit | Attending: Internal Medicine | Admitting: Internal Medicine

## 2022-01-20 DIAGNOSIS — Z1239 Encounter for other screening for malignant neoplasm of breast: Secondary | ICD-10-CM

## 2022-01-20 DIAGNOSIS — Z13820 Encounter for screening for osteoporosis: Secondary | ICD-10-CM

## 2022-01-20 DIAGNOSIS — M8589 Other specified disorders of bone density and structure, multiple sites: Secondary | ICD-10-CM | POA: Diagnosis not present

## 2022-01-20 DIAGNOSIS — Z1231 Encounter for screening mammogram for malignant neoplasm of breast: Secondary | ICD-10-CM | POA: Diagnosis not present

## 2022-01-20 DIAGNOSIS — Z780 Asymptomatic menopausal state: Secondary | ICD-10-CM | POA: Diagnosis not present

## 2022-01-21 ENCOUNTER — Ambulatory Visit (INDEPENDENT_AMBULATORY_CARE_PROVIDER_SITE_OTHER): Payer: Medicare HMO | Admitting: Gastroenterology

## 2022-01-21 ENCOUNTER — Encounter: Payer: Self-pay | Admitting: Gastroenterology

## 2022-01-21 VITALS — BP 118/74 | HR 76 | Ht 60.25 in | Wt 149.0 lb

## 2022-01-21 DIAGNOSIS — K50812 Crohn's disease of both small and large intestine with intestinal obstruction: Secondary | ICD-10-CM

## 2022-01-21 DIAGNOSIS — N83202 Unspecified ovarian cyst, left side: Secondary | ICD-10-CM | POA: Diagnosis not present

## 2022-01-21 NOTE — Patient Instructions (Signed)
If you are age 66 or older, your body mass index should be between 23-30. Your Body mass index is 28.86 kg/m. If this is out of the aforementioned range listed, please consider follow up with your Primary Care Provider.  If you are age 23 or younger, your body mass index should be between 19-25. Your Body mass index is 28.86 kg/m. If this is out of the aformentioned range listed, please consider follow up with your Primary Care Provider.   ________________________________________________________  The [facility] providers would like to encourage you to use [facility] to communicate with providers for non-urgent requests or questions.  Due to long hold times on the telephone, sending your provider a message by [facility] may be a faster and more efficient way to get a response.  Please allow 48 business hours for a response.  Please remember that this is for non-urgent requests.  _______________________________________________________  It was a pleasure to see you today!  Thank you for trusting me with your gastrointestinal care!

## 2022-01-21 NOTE — Progress Notes (Signed)
[facility] Progress Note  Chief Complaint: Ileocolonic Crohn's disease  Subjective  History: [name] follows up for her Crohn's disease.  Clinical details in office consult note February of this year, which was soon after a hospitalization for SBO that responded to conservative therapy. Colonoscopy late March showed an area of abnormal scarred and tethered mucosa in the transverse colon but no active inflammation (which coincided with an area of concern on CT scan).  There was an ilio colonic anastomosis in the a sending with an ulcerated inflamed and stenotic anastomosis that could not be passed with the scope.  The limited visualization of the neo-TI did not seem to show any Crohn's activity there, and subsequent CTE was done.  [name] continues to feel well with no abdominal pain and her typical bowel pattern of 1 semiformed to loose stool per day without blood.  Her appetite is good and weight stable.  ROS: Cardiovascular:  no chest pain Respiratory: no dyspnea  The patient's Past Medical, Family and Social History were reviewed and are on file in the EMR.  Objective:  Med list reviewed  Current Outpatient Medications:    cyanocobalamin (,VITAMIN B-12,) 1000 MCG/ML injection, Inject 87m in deltoid once weekly for 4 weeks, then inject 1 ml once a month thereafter, Disp: 6 mL, Rfl: 1   meclizine (ANTIVERT) 12.5 MG tablet, Take 12.5 mg by mouth 3 (three) times daily as needed for dizziness., Disp: , Rfl:    omeprazole (PRILOSEC) 20 MG capsule, Take 1 capsule (20 mg total) by mouth 2 (two) times daily before a meal., Disp: 60 capsule, Rfl: 0   oxyCODONE-acetaminophen (PERCOCET) 5-325 MG per tablet, Take 1-2 tablets by mouth every 6 (six) hours as needed for severe pain., Disp: 20 tablet, Rfl: 0   promethazine (PHENERGAN) 12.5 MG tablet, Take 12.5 mg by mouth every 6 (six) hours as needed for nausea or vomiting., Disp: , Rfl:    SYRINGE-NEEDLE, DISP, 3 ML (BD SAFETYGLIDE  SYRINGE/NEEDLE) 25G X 1" 3 ML MISC, Use for B12 injections, Disp: 100 each, Rfl: 11   topiramate (TOPAMAX) 200 MG tablet, Take 300 mg by mouth at bedtime., Disp: , Rfl:    ZEMBRACE SYMTOUCH 3 MG/0.5ML SOAJ, Inject 1 Dose into the skin daily as needed for migraine., Disp: , Rfl:    Vital signs in last 24 hrs: Vitals:   01/21/22 0912  BP: 118/74  Pulse: 76  SpO2: 98%   Wt Readings from Last 3 Encounters:  01/21/22 149 lb (67.6 kg)  11/26/21 148 lb (67.1 kg)  08/07/21 148 lb 8 oz (67.4 kg)    Physical Exam  Well-appearing Cardiac: RRR without murmurs, S1S2 heard, no peripheral edema Pulm: clear to auscultation bilaterally, normal RR and effort noted Abdomen: soft, no tenderness, with active bowel sounds. No guarding or palpable hepatosplenomegaly.  Labs:   ___________________________________________ Radiologic studies: CLINICAL DATA:  Crohn disease.   EXAM: CT ABDOMEN AND PELVIS WITH CONTRAST (ENTEROGRAPHY)   TECHNIQUE: Multidetector CT of the abdomen and pelvis during bolus administration of intravenous contrast. Negative oral contrast was given.   RADIATION DOSE REDUCTION: This exam was performed according to the departmental dose-optimization program which includes automated exposure control, adjustment of the mA and/or kV according to patient size and/or use of iterative reconstruction technique.   CONTRAST:  1032mOMNIPAQUE IOHEXOL 300 MG/ML  SOLN   COMPARISON:  07/04/2021   FINDINGS: Lower chest:  Unremarkable   Hepatobiliary: No suspicious focal abnormality within the liver parenchyma. There is  no evidence for gallstones, gallbladder wall thickening, or pericholecystic fluid. No intrahepatic or extrahepatic biliary dilation.   Pancreas: No focal mass lesion. No dilatation of the main duct. No intraparenchymal cyst. No peripancreatic edema.   Spleen: No splenomegaly. No focal mass lesion.   Adrenals/Urinary Tract: No adrenal nodule or mass. Right  kidney unremarkable. 15 mm lower interpolar left renal lesion is compatible with a cyst. No followup recommended. No evidence for hydroureter. Lower ureters and bladder obscured by beam hardening artifact from bilateral hip replacement.   Stomach/Bowel: Stomach is moderately distended with fluid. No wall thickening or abnormal mural enhancement in the stomach. Duodenum is normally positioned as is the ligament of Treitz. No small bowel wall thickening. No small bowel dilatation. No abnormal mucosal or transmural hyperenhancement in the small bowel. No findings to suggest inflammatory or fibrous small bowel stricture. Status post cecectomy. No abnormal wall thickening or enhancement in the neo terminal ileum or ileo colic anastomosis. Colon otherwise unremarkable.   Vascular/Lymphatic: No abdominal aortic aneurysm. There is no gastrohepatic or hepatoduodenal ligament lymphadenopathy. No retroperitoneal or mesenteric lymphadenopathy. No pelvic sidewall lymphadenopathy.   Reproductive: Uterus visualized. 2.7 cm cystic lesion left ovary is new since 03/10/2015.   Other: No intraperitoneal free fluid.   Musculoskeletal: Status post bilateral hip replacement. No worrisome lytic or sclerotic osseous abnormality.   IMPRESSION: 1. No acute findings in the abdomen or pelvis. Specifically, no evidence for abnormal mucosal or transmural hyperenhancement within the stomach, small bowel, or colon. No inflammatory or fibrous small bowel stricture. 2. 2.7 cm simple appearing cyst left ovary. Consensus guidelines suggest no follow-up imaging warranted. Note: This recommendation excludes cysts stable ? 2 years, cysts previously characterized by [place] or MR, corpus luteum cysts and adnexal calcifications without associated soft tissue mass. This recommendation does not apply to premenarchal patients and to those with increased risk (genetic, family history, elevated tumor markers or other  high-risk factors) of ovarian cancer. Reference: JACR 2020 Feb; 17(2):248-254     Electronically Signed   By: Misty Stanley M.D.   On: 12/31/2021 08:04   ____________________________________________ Other:   _____________________________________________ Assessment & Plan  Assessment: Encounter Diagnoses  Name Primary?   Crohn's disease of both small and large intestine with intestinal obstruction ([place]) Yes   Cyst of left ovary    Focal recurrence of Crohn's disease at her ileocolonic anastomosis with inflamed ulcerated stricture precluding passage of colonoscope to the Neo terminal ileum.  Subsequent CTE showed no inflammatory activity in the small bowel. Also, what appeared to be perhaps active inflammation and/or stricturing in the transverse colon on the CT done during hospital stay for SBO were chronic coastal fibrotic changes without stricture or active inflammation.  It is difficult to know if her periodic bowel obstructions that occur every several years have been from intra-abdominal adhesions or the focal stenosis, though I favor the former since she does not have active inflammation in the terminal ileum.  That said, she will most likely develop further stricturing of the anastomosis with risk for recurrent bowel obstruction if she is not on therapy.  Therefore, I recommended she consider biologic therapy with either Humira or Skyrizi, the latter a more recent medication but with good efficacy and safety data.  Mesalamine or budesonide would not be effective and recommended in this case. She is apprehensive about going on treatment because of his risks and would like to consider it further. Overall, she is pleased that the disease has a limited activity and that  she is feeling well overall.  After considering all the factors, she seems inclined not to go on chronic biologic/immunosuppressive therapy but was grateful for the work-up and information to know where she stands at this  point.  I will plan to see her as needed and certainly welcome to call if she wishes to discuss it further or if she is having active issues.  Lastly, incidental benign sounding left ovarian cyst seen on CT enterography.  She does not have a gynecologist and says that Dr. Jerilee Hoh does her routine Pap smears.  I recommend a gynecology evaluation to see if pelvic ultrasound or surveillance imaging is warranted for this.  I offered a referral to gynecology, but she would like this sent to Dr. Jerilee Hoh instead so she can make the decision and referral if necessary.   31 minutes were spent on this encounter (including chart review, history/exam, counseling/coordination of care, and documentation) > 50% of that time was spent on counseling and coordination of care.   Nelida Meuse III

## 2022-01-22 ENCOUNTER — Other Ambulatory Visit: Payer: Self-pay | Admitting: Internal Medicine

## 2022-01-22 DIAGNOSIS — G43719 Chronic migraine without aura, intractable, without status migrainosus: Secondary | ICD-10-CM | POA: Diagnosis not present

## 2022-01-22 DIAGNOSIS — G43019 Migraine without aura, intractable, without status migrainosus: Secondary | ICD-10-CM | POA: Diagnosis not present

## 2022-01-22 DIAGNOSIS — R928 Other abnormal and inconclusive findings on diagnostic imaging of breast: Secondary | ICD-10-CM

## 2022-02-24 ENCOUNTER — Ambulatory Visit: Payer: Medicare HMO

## 2022-02-24 ENCOUNTER — Ambulatory Visit
Admission: RE | Admit: 2022-02-24 | Discharge: 2022-02-24 | Disposition: A | Discharge status: Home / Self Care | Payer: Medicare HMO | Source: Ambulatory Visit | Attending: Internal Medicine | Admitting: Internal Medicine

## 2022-02-24 DIAGNOSIS — R921 Mammographic calcification found on diagnostic imaging of breast: Secondary | ICD-10-CM | POA: Diagnosis not present

## 2022-02-24 DIAGNOSIS — N6324 Unspecified lump in the left breast, lower inner quadrant: Secondary | ICD-10-CM | POA: Diagnosis not present

## 2022-02-24 DIAGNOSIS — N6323 Unspecified lump in the left breast, lower outer quadrant: Secondary | ICD-10-CM | POA: Diagnosis not present

## 2022-02-24 DIAGNOSIS — R928 Other abnormal and inconclusive findings on diagnostic imaging of breast: Secondary | ICD-10-CM

## 2022-03-19 ENCOUNTER — Encounter: Payer: Self-pay | Admitting: Internal Medicine

## 2022-03-19 ENCOUNTER — Ambulatory Visit (INDEPENDENT_AMBULATORY_CARE_PROVIDER_SITE_OTHER): Payer: Medicare HMO | Admitting: Internal Medicine

## 2022-03-19 VITALS — BP 110/80 | HR 64 | Temp 97.6°F | Wt 148.9 lb

## 2022-03-19 DIAGNOSIS — E559 Vitamin D deficiency, unspecified: Secondary | ICD-10-CM

## 2022-03-19 DIAGNOSIS — E538 Deficiency of other specified B group vitamins: Secondary | ICD-10-CM

## 2022-03-19 DIAGNOSIS — K50812 Crohn's disease of both small and large intestine with intestinal obstruction: Secondary | ICD-10-CM | POA: Diagnosis not present

## 2022-03-19 DIAGNOSIS — I872 Venous insufficiency (chronic) (peripheral): Secondary | ICD-10-CM

## 2022-03-19 LAB — VITAMIN D 25 HYDROXY (VIT D DEFICIENCY, FRACTURES): VITD: 28.44 ng/mL — ABNORMAL LOW (ref 30.00–100.00)

## 2022-03-19 LAB — VITAMIN B12: Vitamin B-12: 408 pg/mL (ref 211–911)

## 2022-03-19 NOTE — Progress Notes (Signed)
Established Patient Office Visit     CC/Reason for Visit: Follow-up chronic conditions, discuss acute concerns  HPI: [name] is a 66 y.o. female who is coming in today for the above mentioned reasons. Past Medical History is significant for: Crohn's disease, vitamin D and B12 deficiencies.  She is being followed by GI in regards to her Crohn's.  She is not currently on medication.  She would like to check her vitamin D and B12 levels.  She has been having bilateral lower extremity edema that is worse towards the end of the day, worse when sitting down for prolonged periods of time.   Past Medical/Surgical History: Past Medical History:  Diagnosis Date   Arthritis    Crohn disease ([place]) 07/04/2021   Crohn's disease ([place])    03-26-15 Prednisone sarted 2 weeks ago   Foot drop    right foot-wears brace.   Migraine    On prednisone therapy    started 2 weeks ago   Pancreatitis     Past Surgical History:  Procedure Laterality Date   BOWEL RESECTION     Crohn's disease   COLONOSCOPY     COLONOSCOPY WITH PROPOFOL N/A 04/02/2015   Procedure: COLONOSCOPY WITH PROPOFOL;  Surgeon: Garlan Fair, MD;  Location: [facility];  Service: Endoscopy;  Laterality: N/A;   hip replacement x 5      KNEE SURGERY     scopes   SHOULDER SURGERY     debridement    Social History:  reports that she has never smoked. She has never used smokeless tobacco. She reports current alcohol use. She reports that she does not use drugs.  Allergies: Allergies  Allergen Reactions   Adalimumab     Other reaction(s): emotional, nose bleeds   Certolizumab Pegol     Other reaction(s): depression   Methotrexate     Other reaction(s): HAs   Sulfasalazine     Other reaction(s): Unknown    Family History:  Family History  Problem Relation Age of Onset   Stroke Mother    Diabetes Mother    COPD Father    Arthritis Father    Colon cancer Neg Hx    Rectal cancer Neg Hx    Stomach cancer  Neg Hx      Current Outpatient Medications:    cyanocobalamin (,VITAMIN B-12,) 1000 MCG/ML injection, Inject 33m in deltoid once weekly for 4 weeks, then inject 1 ml once a month thereafter, Disp: 6 mL, Rfl: 1   meclizine (ANTIVERT) 12.5 MG tablet, Take 12.5 mg by mouth 3 (three) times daily as needed for dizziness., Disp: , Rfl:    omeprazole (PRILOSEC) 20 MG capsule, Take 1 capsule (20 mg total) by mouth 2 (two) times daily before a meal., Disp: 60 capsule, Rfl: 0   oxyCODONE-acetaminophen (PERCOCET) 5-325 MG per tablet, Take 1-2 tablets by mouth every 6 (six) hours as needed for severe pain., Disp: 20 tablet, Rfl: 0   promethazine (PHENERGAN) 12.5 MG tablet, Take 12.5 mg by mouth every 6 (six) hours as needed for nausea or vomiting., Disp: , Rfl:    SYRINGE-NEEDLE, DISP, 3 ML (BD SAFETYGLIDE SYRINGE/NEEDLE) 25G X 1" 3 ML MISC, Use for B12 injections, Disp: 100 each, Rfl: 11   topiramate (TOPAMAX) 200 MG tablet, Take 300 mg by mouth at bedtime., Disp: , Rfl:    ZEMBRACE SYMTOUCH 3 MG/0.5ML SOAJ, Inject 1 Dose into the skin daily as needed for migraine., Disp: , Rfl:  Review of Systems:  Constitutional: Denies fever, chills, diaphoresis, appetite change and fatigue.  HEENT: Denies photophobia, eye pain, redness, hearing loss, ear pain, congestion, sore throat, rhinorrhea, sneezing, mouth sores, trouble swallowing, neck pain, neck stiffness and tinnitus.   Respiratory: Denies SOB, DOE, cough, chest tightness,  and wheezing.   Cardiovascular: Denies chest pain, palpitations. Gastrointestinal: Denies nausea, vomiting, abdominal pain, diarrhea, constipation, blood in stool and abdominal distention.  Genitourinary: Denies dysuria, urgency, frequency, hematuria, flank pain and difficulty urinating.  Endocrine: Denies: hot or cold intolerance, sweats, changes in hair or nails, polyuria, polydipsia. Musculoskeletal: Denies myalgias, back pain, joint swelling, arthralgias and gait problem.  Skin:  Denies pallor, rash and wound.  Neurological: Denies dizziness, seizures, syncope, weakness, light-headedness, numbness and headaches.  Hematological: Denies adenopathy. Easy bruising, personal or family bleeding history  Psychiatric/Behavioral: Denies suicidal ideation, mood changes, confusion, nervousness, sleep disturbance and agitation    Physical Exam: Vitals:   03/19/22 1327  BP: 110/80  Pulse: 64  Temp: 97.6 F (36.4 C)  TempSrc: Oral  SpO2: 99%  Weight: 148 lb 14.4 oz (67.5 kg)    Body mass index is 28.84 kg/m.   Constitutional: NAD, calm, comfortable Eyes: PERRL, lids and conjunctivae normal ENMT: Mucous membranes are moist.  Respiratory: clear to auscultation bilaterally, no wheezing, no crackles. Normal respiratory effort. No accessory muscle use.  Cardiovascular: Regular rate and rhythm, no murmurs / rubs / gallops.  1+ pitting bilateral lower extremity edema.   Psychiatric: Normal judgment and insight. Alert and oriented x 3. Normal mood.    Impression and Plan:  Crohn's disease of both small and large intestine with intestinal obstruction ([place]) -Followed by GI and currently under observation.  Vitamin D deficiency  - Plan: VITAMIN D 25 Hydroxy (Vit-D Deficiency, Fractures), CANCELED: VITAMIN D 25 Hydroxy (Vit-D Deficiency, Fractures)  Vitamin B12 deficiency  - Plan: Vitamin B12  Chronic venous insufficiency -Have advised elevation and that she get fitted for compression stockings.    Time spent:31 minutes reviewing chart, interviewing and examining patient and formulating plan of care.    Lelon Frohlich, MD [facility]

## 2022-03-24 DIAGNOSIS — Z79899 Other long term (current) drug therapy: Secondary | ICD-10-CM | POA: Diagnosis not present

## 2022-03-24 DIAGNOSIS — K50918 Crohn's disease, unspecified, with other complication: Secondary | ICD-10-CM | POA: Diagnosis not present

## 2022-03-24 DIAGNOSIS — E663 Overweight: Secondary | ICD-10-CM | POA: Diagnosis not present

## 2022-03-24 DIAGNOSIS — M4690 Unspecified inflammatory spondylopathy, site unspecified: Secondary | ICD-10-CM | POA: Diagnosis not present

## 2022-03-24 DIAGNOSIS — Z6828 Body mass index (BMI) 28.0-28.9, adult: Secondary | ICD-10-CM | POA: Diagnosis not present

## 2022-03-25 ENCOUNTER — Other Ambulatory Visit: Payer: Self-pay | Admitting: Internal Medicine

## 2022-03-25 DIAGNOSIS — E559 Vitamin D deficiency, unspecified: Secondary | ICD-10-CM

## 2022-03-26 ENCOUNTER — Other Ambulatory Visit: Payer: Self-pay | Admitting: Internal Medicine

## 2022-03-26 ENCOUNTER — Ambulatory Visit: Payer: Medicare HMO | Admitting: Internal Medicine

## 2022-03-26 DIAGNOSIS — E559 Vitamin D deficiency, unspecified: Secondary | ICD-10-CM

## 2022-04-15 ENCOUNTER — Encounter: Payer: Self-pay | Admitting: Family Medicine

## 2022-04-15 ENCOUNTER — Other Ambulatory Visit: Payer: Self-pay | Admitting: Internal Medicine

## 2022-04-15 ENCOUNTER — Ambulatory Visit (INDEPENDENT_AMBULATORY_CARE_PROVIDER_SITE_OTHER): Payer: Medicare HMO | Admitting: Family Medicine

## 2022-04-15 VITALS — BP 128/64 | HR 85 | Temp 97.5°F | Ht 60.25 in | Wt 148.5 lb

## 2022-04-15 DIAGNOSIS — M62838 Other muscle spasm: Secondary | ICD-10-CM

## 2022-04-15 DIAGNOSIS — M169 Osteoarthritis of hip, unspecified: Secondary | ICD-10-CM

## 2022-04-15 MED ORDER — CYCLOBENZAPRINE HCL 5 MG PO TABS: ORAL_TABLET | ORAL | 1 refills | Status: DC

## 2022-04-15 NOTE — Progress Notes (Unsigned)
Established Patient Office Visit  Subjective   Patient ID: [name], female    DOB: 05-27-1956  Age: 66 y.o. MRN: [id number]  Chief Complaint  Patient presents with   Torticollis    X3 days     HPI  {History (Optional):23778} Ms. Manter is seen with 3-day history of some left-sided neck pain and stiffness.  She states she woke up with stiffness.  [place] the [place] but denies any injury.  No recent change of pillows.  She tried muscle massage yesterday with a full 1 hour massage which did not seem to help.  She is also tried heat, ice, and topical Voltaren gel without relief.  No radiculitis symptoms.  Symptoms are on the left side of the neck.  No recent headaches.  No upper extremity weakness or numbness.  Past Medical History:  Diagnosis Date   Arthritis    Crohn disease ([place]) 07/04/2021   Crohn's disease ([place])    03-26-15 Prednisone sarted 2 weeks ago   Foot drop    right foot-wears brace.   Migraine    On prednisone therapy    started 2 weeks ago   Pancreatitis    Past Surgical History:  Procedure Laterality Date   BOWEL RESECTION     Crohn's disease   COLONOSCOPY     COLONOSCOPY WITH PROPOFOL N/A 04/02/2015   Procedure: COLONOSCOPY WITH PROPOFOL;  Surgeon: Garlan Fair, MD;  Location: [facility];  Service: Endoscopy;  Laterality: N/A;   hip replacement x 5      KNEE SURGERY     scopes   SHOULDER SURGERY     debridement    reports that she has never smoked. She has never used smokeless tobacco. She reports current alcohol use. She reports that she does not use drugs. family history includes Arthritis in her father; COPD in her father; Diabetes in her mother; Stroke in her mother. Allergies  Allergen Reactions   Adalimumab     Other reaction(s): emotional, nose bleeds   Certolizumab Pegol     Other reaction(s): depression   Methotrexate     Other reaction(s): HAs   Sulfasalazine     Other reaction(s): Unknown    Review of Systems   Cardiovascular:  Negative for chest pain.  Musculoskeletal:  Positive for neck pain.  Neurological:  Negative for focal weakness, weakness and headaches.      Objective:     BP 128/64 (BP Location: Left Arm, Patient Position: Sitting, Cuff Size: Normal)   Pulse 85   Temp (!) 97.5 F (36.4 C) (Oral)   Ht 5' 0.25" (1.53 m)   Wt 148 lb 8 oz (67.4 kg)   SpO2 99%   BMI 28.76 kg/m  {Vitals History (Optional):23777}  Physical Exam Vitals reviewed.  Constitutional:      Appearance: Normal appearance.  Neck:     Comments: She has some pain with extreme flexion and lateral bending especially to the left side as well as rotation to the left.  She has some paracervical muscle tenderness on the left side but not the right.  Mild trapezius tightness and tenderness on the left Neurological:     Mental Status: She is alert.     Comments: Full strength upper extremities with symmetric reflexes upper extremities      No results found for any visits on 04/15/22.  {Labs (Optional):23779}  The 10-year ASCVD risk score (Arnett DK, et al., 2019) is: 5.2%    Assessment &  Plan:   Left-sided neck pain 3 days duration.  Suspect muscle soreness.  No evidence to suggest cervical radiculitis  -Cautious short-term use of Aleve -We discussed short-term use of muscle relaxer with caution regarding sedation and dizziness.  Try Flexeril 5 mg 1-2 nightly. -Continue heat compresses -Can enter physical therapy if not improving next couple weeks  No follow-ups on file.    Carolann Littler, MD

## 2022-04-15 NOTE — Telephone Encounter (Signed)
Spoke to the patient and she states that Dr. Gavin Pound, from [facility] will no longer prescribe Oxycodone for her arthritis, and that Dr Jerilee Hoh agreed to refill it.

## 2022-04-15 NOTE — Telephone Encounter (Signed)
Pt is calling and she has a stiff neck for 3 days. I offer her an appt she want to see with md recommended also pt said dr Jerilee Hoh told her when she needs a refill on oxyCODONE-acetaminophen (PERCOCET) 5-325 MG per tablet  [place], [place] Phone:  [phone]  Fax:  [phone]

## 2022-04-16 ENCOUNTER — Telehealth: Payer: Self-pay | Admitting: *Deleted

## 2022-04-16 MED ORDER — OXYCODONE-ACETAMINOPHEN 5-325 MG PO TABS: 1.0000 | ORAL_TABLET | Freq: Four times a day (QID) | ORAL | 0 refills | Status: DC | PRN

## 2022-04-16 NOTE — Telephone Encounter (Signed)
Spoke with patient and an appointment scheduled for 05/14/22

## 2022-04-16 NOTE — Telephone Encounter (Signed)
Prior [name] has been started for  Oxycodone 5/325 #150  The patient currently has access to the requested medication and a Prior Authorization is not needed for the patient/medication  Spoke with the pharmacist who gave me the number [phone].  They informed me that as long as the Rx is for 30 days a prior auth is not needed.

## 2022-04-16 NOTE — Telephone Encounter (Signed)
Left message on machine for patient to return our call 

## 2022-04-16 NOTE — Telephone Encounter (Signed)
Patient is taking  oxyCODONE-acetaminophen (PERCOCET) 5-325 MG per tablet One tablet every 6 hours #150  Patient is aware of the  referral for pain management. Appointment cancelled.  Fax sent to Dr Trudie Reed for [place] notes

## 2022-05-05 DIAGNOSIS — M4690 Unspecified inflammatory spondylopathy, site unspecified: Secondary | ICD-10-CM | POA: Diagnosis not present

## 2022-05-05 DIAGNOSIS — K50918 Crohn's disease, unspecified, with other complication: Secondary | ICD-10-CM | POA: Diagnosis not present

## 2022-05-05 DIAGNOSIS — Z6828 Body mass index (BMI) 28.0-28.9, adult: Secondary | ICD-10-CM | POA: Diagnosis not present

## 2022-05-05 DIAGNOSIS — M542 Cervicalgia: Secondary | ICD-10-CM | POA: Diagnosis not present

## 2022-05-05 DIAGNOSIS — E663 Overweight: Secondary | ICD-10-CM | POA: Diagnosis not present

## 2022-05-05 DIAGNOSIS — Z79899 Other long term (current) drug therapy: Secondary | ICD-10-CM | POA: Diagnosis not present

## 2022-05-14 ENCOUNTER — Ambulatory Visit: Payer: Medicare HMO | Admitting: Internal Medicine

## 2022-05-15 ENCOUNTER — Other Ambulatory Visit: Payer: Self-pay | Admitting: Internal Medicine

## 2022-05-15 ENCOUNTER — Telehealth: Payer: Self-pay | Admitting: Internal Medicine

## 2022-05-15 DIAGNOSIS — R921 Mammographic calcification found on diagnostic imaging of breast: Secondary | ICD-10-CM

## 2022-05-15 DIAGNOSIS — N6320 Unspecified lump in the left breast, unspecified quadrant: Secondary | ICD-10-CM

## 2022-05-15 NOTE — Telephone Encounter (Signed)
Pt requesting refill  oxyCODONE-acetaminophen (PERCOCET) 5-325 MG tablet has appointment scheduled but will run out prior to the appointment

## 2022-05-18 ENCOUNTER — Telehealth: Payer: Self-pay | Admitting: Internal Medicine

## 2022-05-18 NOTE — Telephone Encounter (Signed)
Last OV 04/15/22

## 2022-05-18 NOTE — Telephone Encounter (Signed)
Pt called again to ask if MD could possibly refill this Rx of the oxyCODONE-acetaminophen (PERCOCET) 5-325 MG tablet  or even send just a few pills to tie her down until her appointment on 05/26/22 with the [facility] MD referred her to?  Please advise.  If so, please send to: [facility] [place], [place] Phone:  [phone]  Fax:  [phone]

## 2022-05-19 MED ORDER — OXYCODONE-ACETAMINOPHEN 5-325 MG PO TABS: 1.0000 | ORAL_TABLET | Freq: Four times a day (QID) | ORAL | 0 refills | Status: DC | PRN

## 2022-05-19 NOTE — Addendum Note (Signed)
Addended by: Lelon Frohlich Y on: 05/19/2022 07:06 AM   Modules accepted: Orders

## 2022-05-19 NOTE — Telephone Encounter (Signed)
LVM for pt to find out how she is taken the percocets and also reminded her that she does not have a signed pain management contract with provider as this will need to be updated. Asked that she come into the office to have this updated. Waiting on response.

## 2022-06-01 DIAGNOSIS — M2550 Pain in unspecified joint: Secondary | ICD-10-CM | POA: Diagnosis not present

## 2022-06-03 ENCOUNTER — Telehealth: Payer: Self-pay | Admitting: Internal Medicine

## 2022-06-03 MED ORDER — MECLIZINE HCL 12.5 MG PO TABS: 12.5000 mg | ORAL_TABLET | Freq: Three times a day (TID) | ORAL | 0 refills | Status: DC | PRN

## 2022-06-03 NOTE — Telephone Encounter (Signed)
Refill sent.

## 2022-06-03 NOTE — Telephone Encounter (Signed)
[place] and would like a refill  on this med meclizine (ANTIVERT) 12.5 MG tablet was prescribed by her old md. Pt would like to take to keep on hand just in case she get dizzy  [place], [place] AT [place] Phone:  [phone]  Fax:  [phone]    Pt will be out of country for 2 wks

## 2022-07-15 DIAGNOSIS — H9313 Tinnitus, bilateral: Secondary | ICD-10-CM | POA: Diagnosis not present

## 2022-07-15 DIAGNOSIS — R42 Dizziness and giddiness: Secondary | ICD-10-CM | POA: Diagnosis not present

## 2022-07-15 DIAGNOSIS — H9042 Sensorineural hearing loss, unilateral, left ear, with unrestricted hearing on the contralateral side: Secondary | ICD-10-CM | POA: Diagnosis not present

## 2022-07-18 ENCOUNTER — Other Ambulatory Visit: Payer: Self-pay | Admitting: Internal Medicine

## 2022-07-18 DIAGNOSIS — E559 Vitamin D deficiency, unspecified: Secondary | ICD-10-CM

## 2022-07-22 DIAGNOSIS — G43019 Migraine without aura, intractable, without status migrainosus: Secondary | ICD-10-CM | POA: Diagnosis not present

## 2022-08-28 DIAGNOSIS — S90424A Blister (nonthermal), right lesser toe(s), initial encounter: Secondary | ICD-10-CM | POA: Diagnosis not present

## 2022-09-17 ENCOUNTER — Ambulatory Visit
Admission: RE | Admit: 2022-09-17 | Discharge: 2022-09-17 | Disposition: A | Discharge status: Home / Self Care | Payer: Medicare HMO | Source: Ambulatory Visit | Attending: Internal Medicine | Admitting: Internal Medicine

## 2022-09-17 ENCOUNTER — Other Ambulatory Visit: Payer: Self-pay | Admitting: Internal Medicine

## 2022-09-17 DIAGNOSIS — N6320 Unspecified lump in the left breast, unspecified quadrant: Secondary | ICD-10-CM

## 2022-09-17 DIAGNOSIS — R921 Mammographic calcification found on diagnostic imaging of breast: Secondary | ICD-10-CM

## 2022-09-17 DIAGNOSIS — N6324 Unspecified lump in the left breast, lower inner quadrant: Secondary | ICD-10-CM | POA: Diagnosis not present

## 2022-09-17 DIAGNOSIS — N6323 Unspecified lump in the left breast, lower outer quadrant: Secondary | ICD-10-CM | POA: Diagnosis not present

## 2022-09-23 DIAGNOSIS — Z79899 Other long term (current) drug therapy: Secondary | ICD-10-CM | POA: Diagnosis not present

## 2022-09-23 DIAGNOSIS — G894 Chronic pain syndrome: Secondary | ICD-10-CM | POA: Diagnosis not present

## 2022-09-23 DIAGNOSIS — Z6827 Body mass index (BMI) 27.0-27.9, adult: Secondary | ICD-10-CM | POA: Diagnosis not present

## 2022-09-23 DIAGNOSIS — M1991 Primary osteoarthritis, unspecified site: Secondary | ICD-10-CM | POA: Diagnosis not present

## 2022-09-23 DIAGNOSIS — E663 Overweight: Secondary | ICD-10-CM | POA: Diagnosis not present

## 2022-09-23 DIAGNOSIS — K50918 Crohn's disease, unspecified, with other complication: Secondary | ICD-10-CM | POA: Diagnosis not present

## 2022-09-23 DIAGNOSIS — M459 Ankylosing spondylitis of unspecified sites in spine: Secondary | ICD-10-CM | POA: Diagnosis not present

## 2022-10-07 ENCOUNTER — Other Ambulatory Visit: Payer: Self-pay | Admitting: Internal Medicine

## 2022-10-07 ENCOUNTER — Encounter: Payer: Self-pay | Admitting: Internal Medicine

## 2022-10-07 ENCOUNTER — Ambulatory Visit (INDEPENDENT_AMBULATORY_CARE_PROVIDER_SITE_OTHER): Payer: Medicare HMO | Admitting: Internal Medicine

## 2022-10-07 VITALS — BP 110/78 | HR 75 | Wt 141.8 lb

## 2022-10-07 DIAGNOSIS — E559 Vitamin D deficiency, unspecified: Secondary | ICD-10-CM

## 2022-10-07 DIAGNOSIS — B353 Tinea pedis: Secondary | ICD-10-CM

## 2022-10-07 DIAGNOSIS — L089 Local infection of the skin and subcutaneous tissue, unspecified: Secondary | ICD-10-CM

## 2022-10-07 LAB — VITAMIN D 25 HYDROXY (VIT D DEFICIENCY, FRACTURES): VITD: 20.1 ng/mL — ABNORMAL LOW (ref 30.00–100.00)

## 2022-10-07 MED ORDER — CYANOCOBALAMIN 1000 MCG/ML IJ SOLN: INTRAMUSCULAR | 11 refills | Status: DC

## 2022-10-07 MED ORDER — VITAMIN D (ERGOCALCIFEROL) 1.25 MG (50000 UNIT) PO CAPS: 50000.0000 [IU] | ORAL_CAPSULE | ORAL | 0 refills | Status: AC

## 2022-10-07 MED ORDER — DOXYCYCLINE HYCLATE 100 MG PO TABS: 100.0000 mg | ORAL_TABLET | Freq: Two times a day (BID) | ORAL | 0 refills | Status: AC

## 2022-10-07 NOTE — Progress Notes (Signed)
Established Patient Office Visit     CC/Reason for Visit: Right foot infection  HPI: [name] is a 67 y.o. female who is coming in today for the above mentioned reasons.  She has a history of right foot drop and decreased sensation.  In December she had a blister in between her fourth and fifth toes that eventually popped and had some drainage.  She went to an urgent care where she received antibiotic therapy.  She is here today because it has worsened.   Past Medical/Surgical History: Past Medical History:  Diagnosis Date   Arthritis    Crohn disease ([place]) 07/04/2021   Crohn's disease ([place])    03-26-15 Prednisone sarted 2 weeks ago   Foot drop    right foot-wears brace.   Migraine    On prednisone therapy    started 2 weeks ago   Pancreatitis     Past Surgical History:  Procedure Laterality Date   BOWEL RESECTION     Crohn's disease   COLONOSCOPY     COLONOSCOPY WITH PROPOFOL N/A 04/02/2015   Procedure: COLONOSCOPY WITH PROPOFOL;  Surgeon: Garlan Fair, MD;  Location: [facility];  Service: Endoscopy;  Laterality: N/A;   hip replacement x 5      KNEE SURGERY     scopes   SHOULDER SURGERY     debridement    Social History:  reports that she has never smoked. She has never used smokeless tobacco. She reports current alcohol use. She reports that she does not use drugs.  Allergies: Allergies  Allergen Reactions   Adalimumab     Other reaction(s): emotional, nose bleeds   Certolizumab Pegol     Other reaction(s): depression   Methotrexate     Other reaction(s): HAs   Sulfasalazine     Other reaction(s): Unknown    Family History:  Family History  Problem Relation Age of Onset   Stroke Mother    Diabetes Mother    COPD Father    Arthritis Father    Colon cancer Neg Hx    Rectal cancer Neg Hx    Stomach cancer Neg Hx      Current Outpatient Medications:    doxycycline (VIBRA-TABS) 100 MG tablet, Take 1 tablet (100 mg total) by mouth 2  (two) times daily for 7 days., Disp: 14 tablet, Rfl: 0   cyanocobalamin (VITAMIN B12) 1000 MCG/ML injection, Inject 30m in deltoid once weekly for 4 weeks, then inject 1 ml once a month thereafter, Disp: 6 mL, Rfl: 11   meclizine (ANTIVERT) 12.5 MG tablet, Take 1 tablet (12.5 mg total) by mouth 3 (three) times daily as needed for dizziness., Disp: 30 tablet, Rfl: 0   promethazine (PHENERGAN) 12.5 MG tablet, Take 12.5 mg by mouth every 6 (six) hours as needed for nausea or vomiting., Disp: , Rfl:    SYRINGE-NEEDLE, DISP, 3 ML (BD SAFETYGLIDE SYRINGE/NEEDLE) 25G X 1" 3 ML MISC, Use for B12 injections, Disp: 100 each, Rfl: 11   topiramate (TOPAMAX) 200 MG tablet, Take 300 mg by mouth at bedtime., Disp: , Rfl:    ZEMBRACE SYMTOUCH 3 MG/0.5ML SOAJ, Inject 1 Dose into the skin daily as needed for migraine., Disp: , Rfl:   Review of Systems:  Negative unless indicated in HPI.   Physical Exam: Vitals:   10/07/22 1019  BP: 110/78  Pulse: 75  SpO2: 98%  Weight: 141 lb 12.8 oz (64.3 kg)    Body mass index is 27.46  kg/m.   Physical Exam Vitals reviewed.  Constitutional:      Appearance: Normal appearance.  HENT:     Head: Normocephalic and atraumatic.  Eyes:     Conjunctiva/sclera: Conjunctivae normal.     Pupils: Pupils are equal, round, and reactive to light.  Musculoskeletal:       Feet:  Feet:     Comments: There is some visible erythema surrounding the base of the fourth and fifth toes.  In between toes is moist, white and macerated tissue.  In between fourth and fifth toes there is a small, superficial wound with surrounding redness. Skin:    General: Skin is warm and dry.  Neurological:     General: No focal deficit present.     Mental Status: She is alert and oriented to person, place, and time.  Psychiatric:        Mood and Affect: Mood normal.        Behavior: Behavior normal.        Thought Content: Thought content normal.        Judgment: Judgment normal.       Impression and Plan:  Right foot infection - Plan: doxycycline (VIBRA-TABS) 100 MG tablet  Athlete's foot on right  -This appears to be primarily a yeast infection/athlete's foot that has progressed into a superficial bacterial infection in between the fourth and fifth right toes.  Have advised washing daily and drying in between toes, Tinactin/Lamisil foot spray.  Because of concern for infection we will send in 7 days of doxycycline.  If no improvement consider podiatry referral.  Time spent:31 minutes reviewing chart, interviewing and examining patient and formulating plan of care.     Lelon Frohlich, MD [facility] at [facility]

## 2022-12-15 DIAGNOSIS — H5203 Hypermetropia, bilateral: Secondary | ICD-10-CM | POA: Diagnosis not present

## 2022-12-15 DIAGNOSIS — H35372 Puckering of macula, left eye: Secondary | ICD-10-CM | POA: Diagnosis not present

## 2022-12-15 DIAGNOSIS — H52203 Unspecified astigmatism, bilateral: Secondary | ICD-10-CM | POA: Diagnosis not present

## 2022-12-15 DIAGNOSIS — H02403 Unspecified ptosis of bilateral eyelids: Secondary | ICD-10-CM | POA: Diagnosis not present

## 2023-01-26 DIAGNOSIS — G43019 Migraine without aura, intractable, without status migrainosus: Secondary | ICD-10-CM | POA: Diagnosis not present

## 2023-01-29 ENCOUNTER — Other Ambulatory Visit: Payer: Self-pay | Admitting: Internal Medicine

## 2023-01-29 DIAGNOSIS — E559 Vitamin D deficiency, unspecified: Secondary | ICD-10-CM

## 2023-02-01 ENCOUNTER — Other Ambulatory Visit: Payer: Self-pay | Admitting: Internal Medicine

## 2023-02-01 DIAGNOSIS — E559 Vitamin D deficiency, unspecified: Secondary | ICD-10-CM

## 2023-02-09 DIAGNOSIS — H02412 Mechanical ptosis of left eyelid: Secondary | ICD-10-CM | POA: Diagnosis not present

## 2023-02-09 DIAGNOSIS — H02423 Myogenic ptosis of bilateral eyelids: Secondary | ICD-10-CM | POA: Diagnosis not present

## 2023-02-09 DIAGNOSIS — H02411 Mechanical ptosis of right eyelid: Secondary | ICD-10-CM | POA: Diagnosis not present

## 2023-02-09 DIAGNOSIS — H02413 Mechanical ptosis of bilateral eyelids: Secondary | ICD-10-CM | POA: Diagnosis not present

## 2023-02-09 DIAGNOSIS — H02422 Myogenic ptosis of left eyelid: Secondary | ICD-10-CM | POA: Diagnosis not present

## 2023-02-09 DIAGNOSIS — H02421 Myogenic ptosis of right eyelid: Secondary | ICD-10-CM | POA: Diagnosis not present

## 2023-02-09 DIAGNOSIS — H0279 Other degenerative disorders of eyelid and periocular area: Secondary | ICD-10-CM | POA: Diagnosis not present

## 2023-02-09 DIAGNOSIS — H53483 Generalized contraction of visual field, bilateral: Secondary | ICD-10-CM | POA: Diagnosis not present

## 2023-02-09 DIAGNOSIS — H02834 Dermatochalasis of left upper eyelid: Secondary | ICD-10-CM | POA: Diagnosis not present

## 2023-02-09 DIAGNOSIS — H02831 Dermatochalasis of right upper eyelid: Secondary | ICD-10-CM | POA: Diagnosis not present

## 2023-02-10 ENCOUNTER — Other Ambulatory Visit: Payer: Medicare HMO

## 2023-03-12 ENCOUNTER — Emergency Department (HOSPITAL_COMMUNITY)
Admission: EM | Admit: 2023-03-12 | Discharge: 2023-03-12 | Disposition: A | Discharge status: Home / Self Care | Payer: Medicare HMO | Attending: Emergency Medicine | Admitting: Emergency Medicine

## 2023-03-12 ENCOUNTER — Other Ambulatory Visit: Payer: Self-pay

## 2023-03-12 ENCOUNTER — Emergency Department (HOSPITAL_COMMUNITY): Payer: Medicare HMO

## 2023-03-12 DIAGNOSIS — R5383 Other fatigue: Secondary | ICD-10-CM | POA: Insufficient documentation

## 2023-03-12 DIAGNOSIS — R930 Abnormal findings on diagnostic imaging of skull and head, not elsewhere classified: Secondary | ICD-10-CM

## 2023-03-12 DIAGNOSIS — R413 Other amnesia: Secondary | ICD-10-CM | POA: Diagnosis not present

## 2023-03-12 DIAGNOSIS — R42 Dizziness and giddiness: Secondary | ICD-10-CM | POA: Diagnosis not present

## 2023-03-12 DIAGNOSIS — I6782 Cerebral ischemia: Secondary | ICD-10-CM | POA: Insufficient documentation

## 2023-03-12 DIAGNOSIS — R29818 Other symptoms and signs involving the nervous system: Secondary | ICD-10-CM | POA: Diagnosis not present

## 2023-03-12 DIAGNOSIS — R200 Anesthesia of skin: Secondary | ICD-10-CM | POA: Diagnosis not present

## 2023-03-12 LAB — COMPREHENSIVE METABOLIC PANEL
ALT: 16 U/L (ref 0–44)
AST: 16 U/L (ref 15–41)
Albumin: 3.8 g/dL (ref 3.5–5.0)
Alkaline Phosphatase: 72 U/L (ref 38–126)
Anion gap: 7 (ref 5–15)
BUN: 10 mg/dL (ref 8–23)
CO2: 21 mmol/L — ABNORMAL LOW (ref 22–32)
Calcium: 8.8 mg/dL — ABNORMAL LOW (ref 8.9–10.3)
Chloride: 112 mmol/L — ABNORMAL HIGH (ref 98–111)
Creatinine, Ser: 0.46 mg/dL (ref 0.44–1.00)
GFR, Estimated: >60 mL/min (ref >60)
Glucose, Bld: 85 mg/dL (ref 70–99)
Potassium: 3.3 mmol/L — ABNORMAL LOW (ref 3.5–5.1)
Sodium: 140 mmol/L (ref 135–145)
Total Bilirubin: 0.5 mg/dL (ref 0.3–1.2)
Total Protein: 6.7 g/dL (ref 6.5–8.1)

## 2023-03-12 LAB — TSH: TSH: 1.602 u[IU]/mL (ref 0.350–4.500)

## 2023-03-12 LAB — CBC WITH DIFFERENTIAL/PLATELET
Abs Immature Granulocytes: 0.02 10*3/uL (ref 0.00–0.07)
Basophils Absolute: 0.1 10*3/uL (ref 0.0–0.1)
Basophils Relative: 1 %
Eosinophils Absolute: 0.1 10*3/uL (ref 0.0–0.5)
Eosinophils Relative: 2 %
HCT: 39.5 % (ref 36.0–46.0)
Hemoglobin: 12.7 g/dL (ref 12.0–15.0)
Immature Granulocytes: 0 %
Lymphocytes Relative: 38 %
Lymphs Abs: 2 10*3/uL (ref 0.7–4.0)
MCH: 31.4 pg (ref 26.0–34.0)
MCHC: 32.2 g/dL (ref 30.0–36.0)
MCV: 97.8 fL (ref 80.0–100.0)
Monocytes Absolute: 0.4 10*3/uL (ref 0.1–1.0)
Monocytes Relative: 8 %
Neutro Abs: 2.7 10*3/uL (ref 1.7–7.7)
Neutrophils Relative %: 51 %
Platelets: 230 10*3/uL (ref 150–400)
RBC: 4.04 MIL/uL (ref 3.87–5.11)
RDW: 12.5 % (ref 11.5–15.5)
WBC: 5.2 10*3/uL (ref 4.0–10.5)
nRBC: 0 % (ref 0.0–0.2)

## 2023-03-12 LAB — URINALYSIS, W/ REFLEX TO CULTURE (INFECTION SUSPECTED)
Bacteria, UA: NONE SEEN
Bilirubin Urine: NEGATIVE
Glucose, UA: NEGATIVE mg/dL
Hgb urine dipstick: NEGATIVE
Ketones, ur: NEGATIVE mg/dL
Leukocytes,Ua: NEGATIVE
Nitrite: NEGATIVE
Protein, ur: NEGATIVE mg/dL
Specific Gravity, Urine: 1.008 (ref 1.005–1.030)
pH: 5 (ref 5.0–8.0)

## 2023-03-12 LAB — MAGNESIUM: Magnesium: 2.1 mg/dL (ref 1.7–2.4)

## 2023-03-12 MED ORDER — SODIUM CHLORIDE 0.9 % IV BOLUS
1000.0000 mL | Freq: Once | INTRAVENOUS | Status: AC
  Administered 2023-03-12: 1000 mL via INTRAVENOUS

## 2023-03-12 MED ORDER — MECLIZINE HCL 25 MG PO TABS
50.0000 mg | ORAL_TABLET | Freq: Once | ORAL | Status: AC
  Administered 2023-03-12: 50 mg via ORAL
  Filled 2023-03-12: qty 2

## 2023-03-12 MED ORDER — MECLIZINE HCL 25 MG PO TABS: 25.0000 mg | ORAL_TABLET | Freq: Three times a day (TID) | ORAL | 0 refills | Status: AC | PRN

## 2023-03-12 MED ORDER — GADOBUTROL 1 MMOL/ML IV SOLN
6.0000 mL | Freq: Once | INTRAVENOUS | Status: AC | PRN
  Administered 2023-03-12: 6 mL via INTRAVENOUS

## 2023-03-12 MED ORDER — POTASSIUM CHLORIDE CRYS ER 20 MEQ PO TBCR
40.0000 meq | EXTENDED_RELEASE_TABLET | Freq: Once | ORAL | Status: AC
  Administered 2023-03-12: 40 meq via ORAL
  Filled 2023-03-12: qty 2

## 2023-03-12 NOTE — ED Triage Notes (Signed)
Pt reports two episodes of vertigo one last week and second one started yesterday. "Felt nauseous for one hour yesterday"

## 2023-03-12 NOTE — Discharge Instructions (Addendum)
Your history, exam, and workup today seem consistent with recurrent vertigo causing your dizziness and symptoms.  The MRI today did not show evidence of acute stroke but did show evidence of possible old stroke.  With this finding, please follow-up with outpatient neurology and a primary doctor.  Please rest and stay hydrated and use the medications.  Please increase your fluids for the next few days.  If any symptoms change or worsen acutely, please return to the nearest emergency department.

## 2023-03-12 NOTE — ED Provider Notes (Signed)
[place] [facility] AT [facility] Provider Note   CSN: [id number] Arrival date & time: 03/12/23  1051     History  Chief Complaint  Patient presents with   Dizziness    [name] is a 67 y.o. female.  The history is provided by the patient and medical records. No language interpreter was used.  Dizziness Quality:  Imbalance (unsteadiness) Severity:  Moderate Onset quality:  Gradual Duration:  2 days Timing:  Sporadic Progression:  Waxing and waning Chronicity:  New Relieved by:  Nothing Worsened by:  Turning head Ineffective treatments:  None tried Associated symptoms: diarrhea   Associated symptoms: no chest pain, no headaches, no nausea, no palpitations, no shortness of breath, no vision changes, no vomiting and no weakness   Risk factors: hx of vertigo        Home Medications Prior to Admission medications   Medication Sig Start Date End Date Taking? Authorizing Provider  cyanocobalamin (VITAMIN B12) 1000 MCG/ML injection Inject 1ml in deltoid once weekly for 4 weeks, then inject 1 ml once a month thereafter 10/07/22   Philip Aspen, Limmie Patricia, MD  meclizine (ANTIVERT) 12.5 MG tablet Take 1 tablet (12.5 mg total) by mouth 3 (three) times daily as needed for dizziness. 06/03/22   Philip Aspen, Limmie Patricia, MD  promethazine (PHENERGAN) 12.5 MG tablet Take 12.5 mg by mouth every 6 (six) hours as needed for nausea or vomiting.    [provider]  SYRINGE-NEEDLE, DISP, 3 ML (BD SAFETYGLIDE SYRINGE/NEEDLE) 25G X 1" 3 ML MISC Use for B12 injections 08/13/21   Philip Aspen, Limmie Patricia, MD  topiramate (TOPAMAX) 200 MG tablet Take 300 mg by mouth at bedtime. 06/25/21   [provider]  ZEMBRACE SYMTOUCH 3 MG/0.5ML SOAJ Inject 1 Dose into the skin daily as needed for migraine. 04/16/21   [provider]      Allergies    Adalimumab, Certolizumab pegol, Methotrexate, and Sulfasalazine    Review of Systems   Review of  Systems  Constitutional:  Positive for fatigue. Negative for chills and fever.  HENT:  Negative for congestion.   Eyes:  Negative for visual disturbance.  Respiratory:  Negative for cough, chest tightness, shortness of breath and wheezing.   Cardiovascular:  Negative for chest pain and palpitations.  Gastrointestinal:  Positive for diarrhea. Negative for abdominal pain, constipation, nausea and vomiting.  Genitourinary:  Positive for frequency. Negative for dysuria.  Musculoskeletal:  Negative for back pain, neck pain and neck stiffness.  Skin:  Negative for rash and wound.  Neurological:  Positive for dizziness. Negative for speech difficulty, weakness, light-headedness, numbness (over last few months has had intermittent numbness in face and diffferent parts of body) and headaches.  Psychiatric/Behavioral:  Negative for agitation and confusion.   All other systems reviewed and are negative.   Physical Exam Updated Vital Signs BP 136/79 (BP Location: Left Arm)   Pulse 71   Temp 97.9 F (36.6 C) (Oral)   Resp 18   Ht 5' (1.524 m)   Wt 61.2 kg   SpO2 100%   BMI 26.37 kg/m  Physical Exam Vitals and nursing note reviewed.  Constitutional:      General: She is not in acute distress.    Appearance: She is well-developed. She is not ill-appearing, toxic-appearing or diaphoretic.  HENT:     Head: Normocephalic and atraumatic.     Nose: Nose normal. No congestion or rhinorrhea.     Mouth/Throat:  Mouth: Mucous membranes are moist.  Eyes:     Extraocular Movements: Extraocular movements intact.     Conjunctiva/sclera: Conjunctivae normal.     Pupils: Pupils are equal, round, and reactive to light.  Neck:     Vascular: No carotid bruit.  Cardiovascular:     Rate and Rhythm: Normal rate and regular rhythm.     Heart sounds: No murmur heard. Pulmonary:     Effort: Pulmonary effort is normal. No respiratory distress.     Breath sounds: Normal breath sounds. No wheezing, rhonchi  or rales.  Chest:     Chest wall: No tenderness.  Abdominal:     General: Abdomen is flat.     Palpations: Abdomen is soft.     Tenderness: There is no abdominal tenderness. There is no guarding or rebound.  Musculoskeletal:        General: No swelling or tenderness.     Cervical back: Neck supple. No tenderness.     Right lower leg: No edema.     Left lower leg: No edema.  Skin:    General: Skin is warm and dry.     Capillary Refill: Capillary refill takes less than 2 seconds.     Coloration: Skin is not pale.     Findings: No erythema or rash.  Neurological:     Mental Status: She is alert.     Cranial Nerves: No cranial nerve deficit.     Sensory: No sensory deficit.     Motor: No weakness.  Psychiatric:        Mood and Affect: Mood normal.     ED Results / Procedures / Treatments   Labs (all labs ordered are listed, but only abnormal results are displayed) Labs Reviewed  COMPREHENSIVE METABOLIC PANEL - Abnormal; Notable for the following components:      Result Value   Potassium 3.3 (*)    Chloride 112 (*)    CO2 21 (*)    Calcium 8.8 (*)    All other components within normal limits  URINALYSIS, W/ REFLEX TO CULTURE (INFECTION SUSPECTED) - Abnormal; Notable for the following components:   Color, Urine STRAW (*)    All other components within normal limits  CBC WITH DIFFERENTIAL/PLATELET  TSH  MAGNESIUM    EKG EKG Interpretation Date/Time:  Friday March 12 2023 13:09:11 EDT Ventricular Rate:  62 PR Interval:  221 QRS Duration:  99 QT Interval:  429 QTC Calculation: 436 R Axis:   108  Text Interpretation: Sinus rhythm Prolonged PR interval Low voltage, precordial leads Consider RVH w/ secondary repol abnormality Baseline wander in lead(s) I III aVL when compared to prior,  more wandering baseline. No STEMI Confirmed by Theda Belfast ([id number]) on 03/12/2023 1:27:58 PM  Radiology MR Brain W and Wo Contrast  Result Date: 03/12/2023 CLINICAL DATA:  Neuro  deficit, acute, stroke suspected 2 days of unsteadiness recurrent, sent for rule out of stroke. Also history of intermittent extremity numbness and memory problems over the last few months. Rule out stroke versus mass versus MS or other. EXAM: MRI HEAD WITHOUT AND WITH CONTRAST TECHNIQUE: Multiplanar, multiecho pulse sequences of the brain and surrounding structures were obtained without and with intravenous contrast. CONTRAST:  6mL GADAVIST GADOBUTROL 1 MMOL/ML IV SOLN COMPARISON:  MR Head 05/19/17 FINDINGS: Brain: Negative for an acute infarct. No hemorrhage. No hydrocephalus. No extra-axial fluid collection. Sequela of minimal chronic microvascular ischemic change. 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 No contrast-enhancing lesions  visualized. No evidence of demyelinating disease. Vascular: Normal flow voids. Skull and upper cervical spine: Normal marrow signal. Sinuses/Orbits: Middle ear or mastoid effusion. Paranasal sinuses are clear. Orbits are unremarkable. Other: None. IMPRESSION: No acute intracranial process. No evidence of acute infarct or demyelinating disease. Electronically Signed   By: Lorenza Cambridge M.D.   On: 03/12/2023 14:57    Procedures Procedures    Medications Ordered in ED Medications  potassium chloride SA (KLOR-CON M) CR tablet 40 mEq (has no administration in time range)  sodium chloride 0.9 % bolus 1,000 mL (1,000 mLs Intravenous New Bag/Given 03/12/23 1259)  meclizine (ANTIVERT) tablet 50 mg (50 mg Oral Given 03/12/23 1301)  gadobutrol (GADAVIST) 1 MMOL/ML injection 6 mL (6 mLs Intravenous Contrast Given 03/12/23 1423)    ED Course/ Medical Decision Making/ A&P                             Medical Decision Making Amount and/or Complexity of Data Reviewed Labs: ordered. Radiology: ordered.  Risk Prescription drug management.    JAHIA DILUZIO is a 67 y.o. female with a past medical history significant for migraines, Crohn's disease, previous small  bowel obstruction, vertigo, migraines, arthritis, and previous right foot drop from a surgical problem who presents for episodic unsteadiness.  According to patient, last week she had 3 days of feeling unsteady with ambulation but did not feel the same type of room spinning dizziness she had with her previous vertigo.  She reports it went away and she did not think much of it.  She reports that yesterday it began and is still persistent today with this unsteady feeling with ambulation.  She reports it is not the dizziness she has typically expected with her vertigo and meclizine that she took yesterday did not seem to help much.  She reports she has had some urinary frequency and some diarrhea and did spend some time at the beach recently.  Denies any focal headache or neck pain with it.  She reports that over the last few months she has been having more forgetfulness and is also had some transient numbness in her face and different parts of her body.  She denies any new trauma.  Denies any recent chiropractor visits or neck massages.  Denies any fevers, chills, congestion, or cough.  Denies any current nausea, vomiting,.  She spoke to her physician who was concerned she may have had a stroke or mini stroke and sent her here for evaluation.  On exam, lungs clear.  Chest nontender.  Abdomen nontender.  No murmur.  She did not have a carotid bruit on my exam.  No stridor.  Patient had intact sensation and strength in upper extremities.  She reports her right leg is numb at baseline with her foot drop but otherwise it was not different than baseline.  Left leg had intact sensation and strength.  Symmetric smile.  Clear speech.  Normal finger-nose-finger testing initially.  No nystagmus on my exam.  Patient did report that moving her head did cause the unsteadiness to worsen at times.  We discussed that this may be an atypical presentation of her previous vertigo however given the concern with her provider for  possible stroke I do feel MRI is warranted.  With the transient facial numbness intermittently over time as well as memory changes, will get MRI with and without contrast to rule out MS or other acute abnormality as well as stroke.  Will give her some fluids I suspect he may have been dehydrated being at the beach in the heat with the reported recent diarrhea and urinary symptoms we will get urinalysis.  Anticipate reassessment after workup to determine disposition.  Patient started to feel better now after the meclizine.  MRI did not show evidence of acute stroke but did show evidence of an old stroke.  She has no history of this so patient will follow-up with outpatient neurology.  Given lack of acute stroke we agree that she does not appear to need admission today.  Patient will get prescription for meclizine and follow-up with PCP and outpatient neurology.  Patient is to return precautions and follow-up instructions and was discharged in good condition with improved symptoms.         Final Clinical Impression(s) / ED Diagnoses Final diagnoses:  Dizziness  Vertigo  Abnormal MRI of head    Rx / DC Orders ED Discharge Orders          Ordered    meclizine (ANTIVERT) 25 MG tablet  3 times daily PRN        03/12/23 1615           Clinical Impression: 1. Dizziness   2. Vertigo   3. Abnormal MRI of head     Disposition: Discharge  Condition: Good  I have discussed the results, Dx and Tx plan with the pt(& family if present). He/she/they expressed understanding and agree(s) with the plan. Discharge instructions discussed at great length. Strict return precautions discussed and pt &/or family have verbalized understanding of the instructions. No further questions at time of discharge.    New Prescriptions   MECLIZINE (ANTIVERT) 25 MG TABLET    Take 1 tablet (25 mg total) by mouth 3 (three) times daily as needed for dizziness.    Follow Up: Philip Aspen, Limmie Patricia,  MD [address] [place] [address] [phone]     [facility] [address] [place] [address] [phone]       Rochell Mabie, Canary Brim, MD 03/12/23 1616

## 2023-03-18 ENCOUNTER — Telehealth: Payer: Self-pay

## 2023-03-18 NOTE — Telephone Encounter (Signed)
Transition Care Management Follow-up Telephone Call Date of discharge and from where: [name] 7/12 How have you been since you were released from the hospital? Doing ok Any questions or concerns? No  Items Reviewed: Did the pt receive and understand the discharge instructions provided? Yes  Medications obtained and verified? Yes  Other? No  Any new allergies since your discharge? No  Dietary orders reviewed? No Do you have support at home? Yes     Follow up appointments reviewed:  PCP Hospital f/u appt confirmed? Yes  Scheduled to see  on  @ . Specialist Hospital f/u appt confirmed? Yes  Scheduled to see  on 7/19 @ . Are transportation arrangements needed? No  If their condition worsens, is the pt aware to call PCP or go to the Emergency Dept.? Yes Was the patient provided with contact information for the PCP's office or ED? Yes Was to pt encouraged to call back with questions or concerns? Yes

## 2023-03-19 ENCOUNTER — Encounter: Payer: Self-pay | Admitting: Neurology

## 2023-03-19 ENCOUNTER — Ambulatory Visit: Payer: Medicare HMO | Admitting: Neurology

## 2023-03-19 VITALS — Ht 60.0 in | Wt 138.0 lb

## 2023-03-19 DIAGNOSIS — M21371 Foot drop, right foot: Secondary | ICD-10-CM

## 2023-03-19 DIAGNOSIS — G43809 Other migraine, not intractable, without status migrainosus: Secondary | ICD-10-CM | POA: Diagnosis not present

## 2023-03-19 NOTE — Progress Notes (Signed)
[facility]  PATIENT: [name] DOB: 10/08/1955  REQUESTING CLINICIAN: Philip Aspen, Estel* HISTORY FROM: Patient and husband  REASON FOR VISIT: Recurrent vertigo/dizziness    HISTORICAL  CHIEF COMPLAINT:  Chief Complaint  Patient presents with   New Patient (Initial Visit)    Rm 13. Patient with spouse. Patient has been having vertigo, for a couple of days at a time. No reports of nausea except for one hour of the couple of days and feels tightening pressure of head and reports having arthritis in the neck. She reports havin gmigraines before but does not correlate them to the vertigo    HISTORY OF PRESENT ILLNESS:  This 67 year old woman past medical history of migraine headaches, history of recurrent vertigo, chron's disease and right foot drop who is presenting with management of her vertigo.  She reports 4 years ago she was diagnosed with vertigo.  She describes her symptoms as head pressure and lightheadedness.  She denies any room spinning sensation, feels like she is lightheaded and needed to lie down for the next 2 days.  These episodes started 4 years ago and diagnosed with vertigo.  She saw ENT at that time, did not make any additional recommendation.  She reports for the longest time she will have 1 episode every 3 to 4 months but recently she had 2 severe episodes that were 2 weeks apart with nausea.  She presented to the ED, MRI brain negative for any acute abnormality and she was discharged home with meclizine. In terms of her diagnosis of migraines headaches, she is on high-dose of Topamax for prevention and she use sumatriptan IM for abortive medication.  This combination works very well for her.  She has not made any change recently.    OTHER MEDICAL CONDITIONS: Migraines headaches, vertigo, Right foot drop, Chron's disease    REVIEW OF SYSTEMS: Full 14 system review of systems performed and negative with exception of: As noted in the  HPI  ALLERGIES: Allergies  Allergen Reactions   Adalimumab     Other reaction(s): emotional, nose bleeds   Certolizumab Pegol     Other reaction(s): depression   Methotrexate     Other reaction(s): HAs   Sulfasalazine     Other reaction(s): Unknown    HOME MEDICATIONS: Outpatient Medications Prior to Visit  Medication Sig Dispense Refill   cyanocobalamin (VITAMIN B12) 1000 MCG/ML injection Inject 1ml in deltoid once weekly for 4 weeks, then inject 1 ml once a month thereafter 6 mL 11   meclizine (ANTIVERT) 12.5 MG tablet Take 1 tablet (12.5 mg total) by mouth 3 (three) times daily as needed for dizziness. 30 tablet 0   meclizine (ANTIVERT) 25 MG tablet Take 1 tablet (25 mg total) by mouth 3 (three) times daily as needed for dizziness. 30 tablet 0   promethazine (PHENERGAN) 12.5 MG tablet Take 12.5 mg by mouth every 6 (six) hours as needed for nausea or vomiting.     SYRINGE-NEEDLE, DISP, 3 ML (BD SAFETYGLIDE SYRINGE/NEEDLE) 25G X 1" 3 ML MISC Use for B12 injections 100 each 11   topiramate (TOPAMAX) 200 MG tablet Take 300 mg by mouth at bedtime.     ZEMBRACE SYMTOUCH 3 MG/0.5ML SOAJ Inject 1 Dose into the skin daily as needed for migraine.     No facility-administered medications prior to visit.    PAST MEDICAL HISTORY: Past Medical History:  Diagnosis Date   Arthritis    Crohn disease (HCC) 07/04/2021   Crohn's  disease (HCC)    03-26-15 Prednisone sarted 2 weeks ago   Foot drop    right foot-wears brace.   Migraine    On prednisone therapy    started 2 weeks ago   Pancreatitis     PAST SURGICAL HISTORY: Past Surgical History:  Procedure Laterality Date   BOWEL RESECTION     Crohn's disease   COLONOSCOPY     COLONOSCOPY WITH PROPOFOL N/A 04/02/2015   Procedure: COLONOSCOPY WITH PROPOFOL;  Surgeon: Charolett Bumpers, MD;  Location: [facility];  Service: Endoscopy;  Laterality: N/A;   hip replacement x 5      KNEE SURGERY     scopes   SHOULDER SURGERY      debridement    FAMILY HISTORY: Family History  Problem Relation Age of Onset   Stroke Mother    Diabetes Mother    COPD Father    Arthritis Father    Colon cancer Neg Hx    Rectal cancer Neg Hx    Stomach cancer Neg Hx     SOCIAL HISTORY: Social History   Socioeconomic History   Marital status: Married    Spouse name: Not on file   Number of children: Not on file   Years of education: Not on file   Highest education level: Bachelor's degree (e.g., BA, AB, BS)  Occupational History   Not on file  Tobacco Use   Smoking status: Never   Smokeless tobacco: Never  Substance and Sexual Activity   Alcohol use: Yes    Comment: social    Drug use: No   Sexual activity: Not on file  Other Topics Concern   Not on file  Social History Narrative   Not on file   Social Determinants of Health   Financial Resource Strain: Low Risk  (03/18/2022)   Overall Financial Resource Strain (CARDIA)    Difficulty of Paying Living Expenses: Not hard at all  Food Insecurity: No Food Insecurity (03/18/2022)   Hunger Vital Sign    Worried About Running Out of Food in the Last Year: Never true    Ran Out of Food in the Last Year: Never true  Transportation Needs: No Transportation Needs (03/18/2022)   PRAPARE - Administrator, Civil Service (Medical): No    Lack of Transportation (Non-Medical): No  Physical Activity: Insufficiently Active (03/18/2022)   Exercise Vital Sign    Days of Exercise per Week: 2 days    Minutes of Exercise per Session: 10 min  Stress: No Stress Concern Present (03/18/2022)   [employer] of Occupational Health - Occupational Stress Questionnaire    Feeling of Stress : Only a little  Social Connections: Moderately Integrated (03/18/2022)   Social Connection and Isolation Panel [NHANES]    Frequency of Communication with Friends and Family: More than three times a week    Frequency of Social Gatherings with Friends and Family: Twice a week    Attends  Religious Services: Never    Database administrator or Organizations: Yes    Attends Banker Meetings: 1 to 4 times per year    Marital Status: Married  Catering manager Violence: Not on file    PHYSICAL EXAM  GENERAL EXAM/CONSTITUTIONAL: Vitals:  Vitals:   03/19/23 1001  Weight: 138 lb (62.6 kg)  Height: 5' (1.524 m)   Body mass index is 26.95 kg/m. Wt Readings from Last 3 Encounters:  03/19/23 138 lb (62.6 kg)  03/12/23 135 lb (61.2 kg)  10/07/22 141 lb 12.8 oz (64.3 kg)   Patient is in no distress; well developed, nourished and groomed; neck is supple  MUSCULOSKELETAL: Gait, strength, tone, movements noted in Neurologic exam below  NEUROLOGIC: MENTAL STATUS:      No data to display         awake, alert, oriented to person, place and time recent and remote memory intact normal attention and concentration language fluent, comprehension intact, naming intact fund of knowledge appropriate  CRANIAL NERVE:  2nd, 3rd, 4th, 6th - Visual fields full to confrontation, extraocular muscles intact, no nystagmus 5th - facial sensation symmetric 7th - facial strength symmetric 8th - hearing intact 9th - palate elevates symmetrically, uvula midline 11th - shoulder shrug symmetric 12th - tongue protrusion midline  MOTOR:  normal bulk and tone, full strength in the BUE, BLE except for a right foot drop   SENSORY:  normal and symmetric to light touch  COORDINATION:  finger-nose-finger, fine finger movements normal  REFLEXES:  deep tendon reflexes present and symmetric  GAIT/STATION:  High steppage gait    DIAGNOSTIC DATA (LABS, IMAGING, TESTING) - I reviewed patient records, labs, notes, testing and imaging myself where available.  Lab Results  Component Value Date   WBC 5.2 03/12/2023   HGB 12.7 03/12/2023   HCT 39.5 03/12/2023   MCV 97.8 03/12/2023   PLT 230 03/12/2023      Component Value Date/Time   NA 140 03/12/2023 1301   K 3.3 (L)  03/12/2023 1301   CL 112 (H) 03/12/2023 1301   CO2 21 (L) 03/12/2023 1301   GLUCOSE 85 03/12/2023 1301   BUN 10 03/12/2023 1301   CREATININE 0.46 03/12/2023 1301   CREATININE 0.40 (L) 06/26/2020 0850   CALCIUM 8.8 (L) 03/12/2023 1301   PROT 6.7 03/12/2023 1301   ALBUMIN 3.8 03/12/2023 1301   AST 16 03/12/2023 1301   ALT 16 03/12/2023 1301   ALKPHOS 72 03/12/2023 1301   BILITOT 0.5 03/12/2023 1301   GFRNONAA >60 03/12/2023 1301   GFRAA >60 05/19/2017 0838   Lab Results  Component Value Date   CHOL 214 (H) 08/07/2021   HDL 94.10 08/07/2021   LDLCALC 106 (H) 08/07/2021   TRIG 69.0 08/07/2021   CHOLHDL 2 08/07/2021   Lab Results  Component Value Date   HGBA1C 5.2 08/07/2021   Lab Results  Component Value Date   VITAMINB12 408 03/19/2022   Lab Results  Component Value Date   TSH 1.602 03/12/2023    MRI Brain 03/12/2023 No acute intracranial process. No evidence of acute infarct or demyelinating disease.    ASSESSMENT AND PLAN  67 y.o. year old female with history of migraine headaches, Crohn's disease, right foot drop who is presenting with episodes of head pressure and lightheadedness.  She describes these episode as vertigo but she never had any room spinning sensation.  In terms of her migraine, she reports having severe headache that would last for couple days initially but she since she start taking Zembrace  and Topamax as prevention her headaches are well-controlled. With these episodes, she will have head pressure followed by lightheadedness and not feeling well for about 2 days.  I do suspect these episodes are vestibular migraine.  I did advise the to take the [facility] as soon as the next episode start.  If this episode is aborted, it would confirm my suspicion of vestibular migraine but if her symptoms get worse with difficulty walking, and nausea, she should contact me and I  will refer her to vestibular therapy. Both patient and husband voiced understanding.      1. Vestibular migraine   2. Right foot drop      Patient Instructions  Continue current medications  I suspect these events are vestibular migraines, please take Zembrace as soon the episode start  Continue to follow up with PCP  Return if worse   No orders of the defined types were placed in this encounter.   No orders of the defined types were placed in this encounter.   Return if symptoms worsen or fail to improve.    Windell Norfolk, MD 03/20/2023, 6:55 AM  [facility] [address], [address] [place], [place] [address] [phone]

## 2023-03-19 NOTE — Patient Instructions (Signed)
Continue current medications  I suspect these events are vestibular migraines, please take Zembrace as soon the episode start  Continue to follow up with PCP  Return if worse

## 2023-04-14 DIAGNOSIS — Z6826 Body mass index (BMI) 26.0-26.9, adult: Secondary | ICD-10-CM | POA: Diagnosis not present

## 2023-04-14 DIAGNOSIS — M459 Ankylosing spondylitis of unspecified sites in spine: Secondary | ICD-10-CM | POA: Diagnosis not present

## 2023-04-14 DIAGNOSIS — E663 Overweight: Secondary | ICD-10-CM | POA: Diagnosis not present

## 2023-04-14 DIAGNOSIS — G894 Chronic pain syndrome: Secondary | ICD-10-CM | POA: Diagnosis not present

## 2023-04-14 DIAGNOSIS — K50918 Crohn's disease, unspecified, with other complication: Secondary | ICD-10-CM | POA: Diagnosis not present

## 2023-04-14 DIAGNOSIS — Z79899 Other long term (current) drug therapy: Secondary | ICD-10-CM | POA: Diagnosis not present

## 2023-04-14 DIAGNOSIS — M1991 Primary osteoarthritis, unspecified site: Secondary | ICD-10-CM | POA: Diagnosis not present

## 2023-04-14 DIAGNOSIS — G43809 Other migraine, not intractable, without status migrainosus: Secondary | ICD-10-CM | POA: Diagnosis not present

## 2023-06-10 DIAGNOSIS — N898 Other specified noninflammatory disorders of vagina: Secondary | ICD-10-CM | POA: Diagnosis not present

## 2023-06-10 DIAGNOSIS — R102 Pelvic and perineal pain: Secondary | ICD-10-CM | POA: Diagnosis not present

## 2023-06-10 DIAGNOSIS — N83209 Unspecified ovarian cyst, unspecified side: Secondary | ICD-10-CM | POA: Diagnosis not present

## 2023-06-21 DIAGNOSIS — H02834 Dermatochalasis of left upper eyelid: Secondary | ICD-10-CM | POA: Diagnosis not present

## 2023-06-21 DIAGNOSIS — D485 Neoplasm of uncertain behavior of skin: Secondary | ICD-10-CM | POA: Diagnosis not present

## 2023-06-21 DIAGNOSIS — H02422 Myogenic ptosis of left eyelid: Secondary | ICD-10-CM | POA: Diagnosis not present

## 2023-06-21 DIAGNOSIS — H53483 Generalized contraction of visual field, bilateral: Secondary | ICD-10-CM | POA: Diagnosis not present

## 2023-06-21 DIAGNOSIS — H02413 Mechanical ptosis of bilateral eyelids: Secondary | ICD-10-CM | POA: Diagnosis not present

## 2023-06-21 DIAGNOSIS — H02421 Myogenic ptosis of right eyelid: Secondary | ICD-10-CM | POA: Diagnosis not present

## 2023-06-21 DIAGNOSIS — Z01818 Encounter for other preprocedural examination: Secondary | ICD-10-CM | POA: Diagnosis not present

## 2023-06-21 DIAGNOSIS — H0279 Other degenerative disorders of eyelid and periocular area: Secondary | ICD-10-CM | POA: Diagnosis not present

## 2023-06-21 DIAGNOSIS — H02412 Mechanical ptosis of left eyelid: Secondary | ICD-10-CM | POA: Diagnosis not present

## 2023-06-21 DIAGNOSIS — H02423 Myogenic ptosis of bilateral eyelids: Secondary | ICD-10-CM | POA: Diagnosis not present

## 2023-06-21 DIAGNOSIS — H02831 Dermatochalasis of right upper eyelid: Secondary | ICD-10-CM | POA: Diagnosis not present

## 2023-06-21 DIAGNOSIS — H02411 Mechanical ptosis of right eyelid: Secondary | ICD-10-CM | POA: Diagnosis not present

## 2023-07-05 ENCOUNTER — Telehealth: Payer: Self-pay | Admitting: Neurology

## 2023-07-05 DIAGNOSIS — H2000 Unspecified acute and subacute iridocyclitis: Secondary | ICD-10-CM | POA: Diagnosis not present

## 2023-07-05 DIAGNOSIS — N83202 Unspecified ovarian cyst, left side: Secondary | ICD-10-CM | POA: Diagnosis not present

## 2023-07-05 DIAGNOSIS — R102 Pelvic and perineal pain: Secondary | ICD-10-CM | POA: Diagnosis not present

## 2023-07-05 DIAGNOSIS — H5712 Ocular pain, left eye: Secondary | ICD-10-CM | POA: Diagnosis not present

## 2023-07-05 NOTE — Telephone Encounter (Signed)
Returned call to patient who states for the last week she had had pain behind her left eye and describes it as feeling pressure and swollen and she though it was allergies. She is having vertigo episode today and a headache yesterday but was relieved with sumatriptan. She has an appointment with eye MD today. Advised I would let Dr. Teresa Coombs know and asked she have them fax over her office notes after the visit today. Patient verbalized understanding.

## 2023-07-05 NOTE — Telephone Encounter (Signed)
Pt said, having another episode feels like vertigo; dizziness,eye hurts, Have an appt with eye doctor today because eye feels different than  before (eye ball hurts in the back of the eye). Would like a call back.

## 2023-07-07 DIAGNOSIS — H2000 Unspecified acute and subacute iridocyclitis: Secondary | ICD-10-CM | POA: Diagnosis not present

## 2023-07-12 DIAGNOSIS — Z01818 Encounter for other preprocedural examination: Secondary | ICD-10-CM | POA: Diagnosis not present

## 2023-07-12 DIAGNOSIS — H02413 Mechanical ptosis of bilateral eyelids: Secondary | ICD-10-CM | POA: Diagnosis not present

## 2023-07-12 DIAGNOSIS — H02411 Mechanical ptosis of right eyelid: Secondary | ICD-10-CM | POA: Diagnosis not present

## 2023-07-12 DIAGNOSIS — H02834 Dermatochalasis of left upper eyelid: Secondary | ICD-10-CM | POA: Diagnosis not present

## 2023-07-12 DIAGNOSIS — H02421 Myogenic ptosis of right eyelid: Secondary | ICD-10-CM | POA: Diagnosis not present

## 2023-07-12 DIAGNOSIS — L988 Other specified disorders of the skin and subcutaneous tissue: Secondary | ICD-10-CM | POA: Diagnosis not present

## 2023-07-12 DIAGNOSIS — H02412 Mechanical ptosis of left eyelid: Secondary | ICD-10-CM | POA: Diagnosis not present

## 2023-07-12 DIAGNOSIS — H0279 Other degenerative disorders of eyelid and periocular area: Secondary | ICD-10-CM | POA: Diagnosis not present

## 2023-07-12 DIAGNOSIS — H02831 Dermatochalasis of right upper eyelid: Secondary | ICD-10-CM | POA: Diagnosis not present

## 2023-07-12 DIAGNOSIS — H02423 Myogenic ptosis of bilateral eyelids: Secondary | ICD-10-CM | POA: Diagnosis not present

## 2023-07-12 DIAGNOSIS — D485 Neoplasm of uncertain behavior of skin: Secondary | ICD-10-CM | POA: Diagnosis not present

## 2023-07-12 DIAGNOSIS — H02422 Myogenic ptosis of left eyelid: Secondary | ICD-10-CM | POA: Diagnosis not present

## 2023-08-12 DIAGNOSIS — G43019 Migraine without aura, intractable, without status migrainosus: Secondary | ICD-10-CM | POA: Diagnosis not present

## 2023-09-19 ENCOUNTER — Ambulatory Visit
Admission: EM | Admit: 2023-09-19 | Discharge: 2023-09-19 | Disposition: A | Discharge status: Home / Self Care | Payer: Medicare HMO | Attending: Physician Assistant | Admitting: Physician Assistant

## 2023-09-19 ENCOUNTER — Other Ambulatory Visit: Payer: Self-pay

## 2023-09-19 ENCOUNTER — Encounter: Payer: Self-pay | Admitting: Emergency Medicine

## 2023-09-19 DIAGNOSIS — H1032 Unspecified acute conjunctivitis, left eye: Secondary | ICD-10-CM

## 2023-09-19 MED ORDER — POLYMYXIN B-TRIMETHOPRIM 10000-0.1 UNIT/ML-% OP SOLN: 1.0000 [drp] | OPHTHALMIC | 0 refills | Status: AC

## 2023-09-19 NOTE — ED Provider Notes (Signed)
[facility]    CSN: [id number] Arrival date & time: 09/19/23  0817      History   Chief Complaint Chief Complaint  Patient presents with   Conjunctivitis   Cough    HPI [name] is a 68 y.o. female.   Patient here today for evaluation of possible pinkeye.  She reports that she had similar weeks ago and symptoms had resolved but then she started using the ointment again when symptoms started a few days ago and symptoms have worsened since then.  She notes symptoms are present in her left eye at this time.  She also reports some mild sore throat that started yesterday and she is concerned for possible return of upper respiratory infection.  She has not had fever.  The history is provided by the patient.    Past Medical History:  Diagnosis Date   Arthritis    Crohn disease (HCC) 07/04/2021   Crohn's disease (HCC)    03-26-15 Prednisone sarted 2 weeks ago   Foot drop    right foot-wears brace.   Migraine    On prednisone therapy    started 2 weeks ago   Pancreatitis     Patient Active Problem List   Diagnosis Date Noted   Vertigo 08/07/2021   SBO (small bowel obstruction) (HCC) 07/04/2021   Crohn disease (HCC) 07/04/2021   Vitamin D deficiency 06/27/2020   Migraine    Arthritis    Foot drop    Presence of unspecified artificial hip joint 11/01/2013   OA (osteoarthritis) of hip 04/27/2013    Past Surgical History:  Procedure Laterality Date   BOWEL RESECTION     Crohn's disease   COLONOSCOPY     COLONOSCOPY WITH PROPOFOL N/A 04/02/2015   Procedure: COLONOSCOPY WITH PROPOFOL;  Surgeon: Charolett Bumpers, MD;  Location: [facility];  Service: Endoscopy;  Laterality: N/A;   hip replacement x 5      KNEE SURGERY     scopes   SHOULDER SURGERY     debridement    OB History   No obstetric history on file.      Home Medications    Prior to Admission medications   Medication Sig Start Date End Date Taking? Authorizing Provider   trimethoprim-polymyxin b (POLYTRIM) ophthalmic solution Place 1 drop into the left eye every 4 (four) hours for 7 days. 09/19/23 09/26/23 Yes Tomi Bamberger, PA-C  cyanocobalamin (VITAMIN B12) 1000 MCG/ML injection Inject 1ml in deltoid once weekly for 4 weeks, then inject 1 ml once a month thereafter 10/07/22   Philip Aspen, Limmie Patricia, MD  meclizine (ANTIVERT) 12.5 MG tablet Take 1 tablet (12.5 mg total) by mouth 3 (three) times daily as needed for dizziness. 06/03/22   Philip Aspen, Limmie Patricia, MD  meclizine (ANTIVERT) 25 MG tablet Take 1 tablet (25 mg total) by mouth 3 (three) times daily as needed for dizziness. 03/12/23   Tegeler, Canary Brim, MD  promethazine (PHENERGAN) 12.5 MG tablet Take 12.5 mg by mouth every 6 (six) hours as needed for nausea or vomiting.    [provider]  SYRINGE-NEEDLE, DISP, 3 ML (BD SAFETYGLIDE SYRINGE/NEEDLE) 25G X 1" 3 ML MISC Use for B12 injections 08/13/21   Philip Aspen, Limmie Patricia, MD  topiramate (TOPAMAX) 200 MG tablet Take 300 mg by mouth at bedtime. 06/25/21   [provider]  ZEMBRACE SYMTOUCH 3 MG/0.5ML SOAJ Inject 1 Dose into the skin daily as needed for migraine. 04/16/21   [provider]    Family History Family History  Problem Relation Age of Onset   Stroke Mother    Diabetes Mother    COPD Father    Arthritis Father    Colon cancer Neg Hx    Rectal cancer Neg Hx    Stomach cancer Neg Hx     Social History Social History   Tobacco Use   Smoking status: Never   Smokeless tobacco: Never  Substance Use Topics   Alcohol use: Yes    Comment: social    Drug use: No     Allergies   Adalimumab, Certolizumab pegol, Methotrexate, and Sulfasalazine   Review of Systems Review of Systems  Constitutional:  Negative for chills and fever.  HENT:  Positive for sore throat. Negative for congestion.   Eyes:  Positive for discharge and redness.  Respiratory:  Negative for shortness of breath.    Gastrointestinal:  Negative for abdominal pain, nausea and vomiting.     Physical Exam Triage Vital Signs ED Triage Vitals  Encounter Vitals Group     BP      Systolic BP Percentile      Diastolic BP Percentile      Pulse      Resp      Temp      Temp src      SpO2      Weight      Height      Head Circumference      Peak Flow      Pain Score      Pain Loc      Pain Education      Exclude from Growth Chart    No data found.  Updated Vital Signs BP (!) 148/79 (BP Location: Left Arm)   Pulse 71   Temp 98 F (36.7 C) (Oral)   Resp 18   SpO2 97%   Visual Acuity Right Eye Distance:   Left Eye Distance:   Bilateral Distance:    Right Eye Near:   Left Eye Near:    Bilateral Near:     Physical Exam Vitals and nursing note reviewed.  Constitutional:      General: She is not in acute distress.    Appearance: Normal appearance. She is not ill-appearing.  HENT:     Head: Normocephalic and atraumatic.  Eyes:     Extraocular Movements: Extraocular movements intact.     Pupils: Pupils are equal, round, and reactive to light.     Comments: Conjunctiva injected to left eye  Cardiovascular:     Rate and Rhythm: Normal rate.  Pulmonary:     Effort: Pulmonary effort is normal.  Neurological:     Mental Status: She is alert.  Psychiatric:        Mood and Affect: Mood normal.        Behavior: Behavior normal.        Thought Content: Thought content normal.      UC Treatments / Results  Labs (all labs ordered are listed, but only abnormal results are displayed) Labs Reviewed - No data to display  EKG   Radiology No results found.  Procedures Procedures (including critical care time)  Medications Ordered in UC Medications - No data to display  Initial Impression / Assessment and Plan / UC Course  I have reviewed the triage vital signs and the nursing notes.  Pertinent labs & imaging results that were available during my care of the patient were  reviewed by me and considered in my medical decision making (see chart for details).     Will treat to cover conjunctivitis with Polytrim drops and discussed that appointment may have been contaminated from prior illness.  Recommended follow-up with ophthalmologist if no gradual improvement over the next few days or sooner with any further concerns.    Reassured that current upper respiratory symptoms are mild and do not have indication for treatment but encouraged follow-up if they persist.   Final Clinical Impressions(s) / UC Diagnoses   Final diagnoses:  Acute conjunctivitis of left eye, unspecified acute conjunctivitis type   Discharge Instructions   None    ED Prescriptions     Medication Sig Dispense Auth. Provider   trimethoprim-polymyxin b (POLYTRIM) ophthalmic solution Place 1 drop into the left eye every 4 (four) hours for 7 days. 10 mL Tomi Bamberger, PA-C      PDMP not reviewed this encounter.   Tomi Bamberger, PA-C 09/19/23 1106

## 2023-09-19 NOTE — ED Triage Notes (Signed)
Pt here for possible pink eye; pt sts has had some meds with relief but now returning; pt sts cough x 3 weeks; pt sts seen x 3 with multiple meds given

## 2023-09-21 DIAGNOSIS — H10413 Chronic giant papillary conjunctivitis, bilateral: Secondary | ICD-10-CM | POA: Diagnosis not present

## 2023-10-27 DIAGNOSIS — H16213 Exposure keratoconjunctivitis, bilateral: Secondary | ICD-10-CM | POA: Diagnosis not present

## 2023-10-27 DIAGNOSIS — Z09 Encounter for follow-up examination after completed treatment for conditions other than malignant neoplasm: Secondary | ICD-10-CM | POA: Diagnosis not present

## 2023-10-27 DIAGNOSIS — H02531 Eyelid retraction right upper eyelid: Secondary | ICD-10-CM | POA: Diagnosis not present

## 2023-10-27 DIAGNOSIS — H04123 Dry eye syndrome of bilateral lacrimal glands: Secondary | ICD-10-CM | POA: Diagnosis not present

## 2023-10-27 DIAGNOSIS — Z01818 Encounter for other preprocedural examination: Secondary | ICD-10-CM | POA: Diagnosis not present

## 2023-10-27 DIAGNOSIS — H0279 Other degenerative disorders of eyelid and periocular area: Secondary | ICD-10-CM | POA: Diagnosis not present

## 2023-10-27 DIAGNOSIS — L905 Scar conditions and fibrosis of skin: Secondary | ICD-10-CM | POA: Diagnosis not present

## 2023-11-14 DIAGNOSIS — R9431 Abnormal electrocardiogram [ECG] [EKG]: Secondary | ICD-10-CM | POA: Diagnosis not present

## 2023-11-14 DIAGNOSIS — G43109 Migraine with aura, not intractable, without status migrainosus: Secondary | ICD-10-CM | POA: Diagnosis not present

## 2023-11-14 DIAGNOSIS — R42 Dizziness and giddiness: Secondary | ICD-10-CM | POA: Diagnosis not present

## 2023-11-14 DIAGNOSIS — I440 Atrioventricular block, first degree: Secondary | ICD-10-CM | POA: Diagnosis not present

## 2023-11-14 DIAGNOSIS — I639 Cerebral infarction, unspecified: Secondary | ICD-10-CM | POA: Diagnosis not present

## 2023-11-14 DIAGNOSIS — G43909 Migraine, unspecified, not intractable, without status migrainosus: Secondary | ICD-10-CM | POA: Diagnosis not present

## 2023-12-22 DIAGNOSIS — H2513 Age-related nuclear cataract, bilateral: Secondary | ICD-10-CM | POA: Diagnosis not present

## 2023-12-22 DIAGNOSIS — H16223 Keratoconjunctivitis sicca, not specified as Sjogren's, bilateral: Secondary | ICD-10-CM | POA: Diagnosis not present

## 2023-12-22 DIAGNOSIS — H52203 Unspecified astigmatism, bilateral: Secondary | ICD-10-CM | POA: Diagnosis not present

## 2024-01-03 DIAGNOSIS — I719 Aortic aneurysm of unspecified site, without rupture: Secondary | ICD-10-CM | POA: Diagnosis not present

## 2024-01-03 DIAGNOSIS — G43909 Migraine, unspecified, not intractable, without status migrainosus: Secondary | ICD-10-CM | POA: Diagnosis not present

## 2024-01-03 DIAGNOSIS — R600 Localized edema: Secondary | ICD-10-CM | POA: Diagnosis not present

## 2024-01-03 DIAGNOSIS — R079 Chest pain, unspecified: Secondary | ICD-10-CM | POA: Diagnosis not present

## 2024-01-03 DIAGNOSIS — Z8719 Personal history of other diseases of the digestive system: Secondary | ICD-10-CM | POA: Diagnosis not present

## 2024-01-03 DIAGNOSIS — M1990 Unspecified osteoarthritis, unspecified site: Secondary | ICD-10-CM | POA: Diagnosis not present

## 2024-01-03 DIAGNOSIS — R0602 Shortness of breath: Secondary | ICD-10-CM | POA: Diagnosis not present

## 2024-01-03 DIAGNOSIS — M79602 Pain in left arm: Secondary | ICD-10-CM | POA: Diagnosis not present

## 2024-01-03 DIAGNOSIS — R202 Paresthesia of skin: Secondary | ICD-10-CM | POA: Diagnosis not present

## 2024-01-03 DIAGNOSIS — R200 Anesthesia of skin: Secondary | ICD-10-CM | POA: Diagnosis not present

## 2024-01-03 DIAGNOSIS — Z66 Do not resuscitate: Secondary | ICD-10-CM | POA: Diagnosis not present

## 2024-01-03 DIAGNOSIS — R519 Headache, unspecified: Secondary | ICD-10-CM | POA: Diagnosis not present

## 2024-01-03 DIAGNOSIS — X500XXA Overexertion from strenuous movement or load, initial encounter: Secondary | ICD-10-CM | POA: Diagnosis not present

## 2024-01-03 DIAGNOSIS — R0789 Other chest pain: Secondary | ICD-10-CM | POA: Diagnosis not present

## 2024-01-04 DIAGNOSIS — R0789 Other chest pain: Secondary | ICD-10-CM | POA: Diagnosis not present

## 2024-01-04 DIAGNOSIS — R079 Chest pain, unspecified: Secondary | ICD-10-CM | POA: Diagnosis not present

## 2024-01-04 DIAGNOSIS — R0602 Shortness of breath: Secondary | ICD-10-CM | POA: Diagnosis not present

## 2024-01-04 DIAGNOSIS — R519 Headache, unspecified: Secondary | ICD-10-CM | POA: Diagnosis not present

## 2024-01-06 DIAGNOSIS — M25512 Pain in left shoulder: Secondary | ICD-10-CM | POA: Diagnosis not present

## 2024-01-06 DIAGNOSIS — Z87891 Personal history of nicotine dependence: Secondary | ICD-10-CM | POA: Diagnosis not present

## 2024-01-06 DIAGNOSIS — R079 Chest pain, unspecified: Secondary | ICD-10-CM | POA: Diagnosis not present

## 2024-01-06 DIAGNOSIS — M5410 Radiculopathy, site unspecified: Secondary | ICD-10-CM | POA: Diagnosis not present

## 2024-01-06 DIAGNOSIS — M542 Cervicalgia: Secondary | ICD-10-CM | POA: Diagnosis not present

## 2024-01-06 DIAGNOSIS — M79602 Pain in left arm: Secondary | ICD-10-CM | POA: Diagnosis not present

## 2024-01-06 DIAGNOSIS — M79605 Pain in left leg: Secondary | ICD-10-CM | POA: Diagnosis not present

## 2024-01-06 DIAGNOSIS — G43909 Migraine, unspecified, not intractable, without status migrainosus: Secondary | ICD-10-CM | POA: Diagnosis not present

## 2024-01-07 ENCOUNTER — Ambulatory Visit: Payer: Self-pay

## 2024-01-07 DIAGNOSIS — B029 Zoster without complications: Secondary | ICD-10-CM | POA: Diagnosis not present

## 2024-01-07 NOTE — Telephone Encounter (Signed)
 Copied from CRM [id number]. Topic: Clinical - Red Word Triage >> Jan 07, 2024  4:57 PM Alyse July wrote: Red Word that prompted transfer to Nurse Triage: chest pain   Chief Complaint: Chest pain Symptoms: Chest pain, left shoulder and arm pain, rash to left hand Frequency: Intermittent  Pertinent Negatives: Patient denies shortness of breath  Disposition: [] ED /[] Urgent Care (no appt availability in office) / [x] Appointment(In office/virtual)/ []  [facility]/ [] Home Care/ [] Refused Recommended Disposition /[] [facility]/ []  Follow-up with PCP Additional Notes: Patient states she is out of town in [place]  and has been experiencing left sided chest pain for a week. She states her pain goes from her left chest to her left shoulder and down her left arm to her hand. She states she was seen in the ED yesterday for the same and was told it was not cardiac related, and that they believe it might be shingles. Patient also now has a rash to her left hand. Appointment made for the patient for Tuesday for follow up when she returns to town.     Reason for Disposition  Rash in same area as pain (may be described as "small blisters")  Answer Assessment - Initial Assessment Questions 1. LOCATION: "Where does it hurt?"       Left sided chest pain 2. RADIATION: "Does the pain go anywhere else?" (e.g., into neck, jaw, arms, back)     Across left shoulder an down to hand  3. ONSET: "When did the chest pain begin?" (Minutes, hours or days)      1 weeks  4. PATTERN: "Does the pain come and go, or has it been constant since it started?"  "Does it get worse with exertion?"      Intermittent  5. DURATION: "How long does it last" (e.g., seconds, minutes, hours)     A couple of minutes  6. SEVERITY: "How bad is the pain?"  (e.g., Scale 1-10; mild, moderate, or severe)    - MILD (1-3): doesn't interfere with normal activities     - MODERATE (4-7): interferes with normal activities or  awakens from sleep    - SEVERE (8-10): excruciating pain, unable to do any normal activities       10/10 7. CARDIAC RISK FACTORS: "Do you have any history of heart problems or risk factors for heart disease?" (e.g., angina, prior heart attack; diabetes, high blood pressure, high cholesterol, smoker, or strong family history of heart disease)     No 8. PULMONARY RISK FACTORS: "Do you have any history of lung disease?"  (e.g., blood clots in lung, asthma, emphysema, birth control pills)     No 9. CAUSE: "What do you think is causing the chest pain?"     Unsure  10. OTHER SYMPTOMS: "Do you have any other symptoms?" (e.g., dizziness, nausea, vomiting, sweating, fever, difficulty breathing, cough)       Rash on left hand  Protocols used: Chest Pain-A-AH

## 2024-01-11 ENCOUNTER — Ambulatory Visit: Payer: Self-pay

## 2024-01-11 ENCOUNTER — Ambulatory Visit: Payer: Self-pay | Admitting: Internal Medicine

## 2024-01-11 ENCOUNTER — Encounter: Payer: Self-pay | Admitting: Internal Medicine

## 2024-01-11 ENCOUNTER — Ambulatory Visit (INDEPENDENT_AMBULATORY_CARE_PROVIDER_SITE_OTHER): Admitting: Internal Medicine

## 2024-01-11 VITALS — BP 130/80 | HR 85 | Temp 97.6°F | Wt 156.5 lb

## 2024-01-11 DIAGNOSIS — E559 Vitamin D deficiency, unspecified: Secondary | ICD-10-CM

## 2024-01-11 DIAGNOSIS — B029 Zoster without complications: Secondary | ICD-10-CM | POA: Diagnosis not present

## 2024-01-11 DIAGNOSIS — I5032 Chronic diastolic (congestive) heart failure: Secondary | ICD-10-CM | POA: Diagnosis not present

## 2024-01-11 LAB — VITAMIN D 25 HYDROXY (VIT D DEFICIENCY, FRACTURES): VITD: 19.45 ng/mL — ABNORMAL LOW (ref 30.00–100.00)

## 2024-01-11 MED ORDER — "BD SAFETYGLIDE SYRINGE/NEEDLE 25G X 1"" 3 ML MISC": 11 refills | Status: DC

## 2024-01-11 MED ORDER — CYANOCOBALAMIN 1000 MCG/ML IJ SOLN: 1000.0000 ug | INTRAMUSCULAR | 11 refills | Status: DC

## 2024-01-11 MED ORDER — VITAMIN D (ERGOCALCIFEROL) 1.25 MG (50000 UNIT) PO CAPS
50000.0000 [IU] | ORAL_CAPSULE | ORAL | 0 refills | Status: DC
Start: 2024-01-11 — End: 2024-03-29

## 2024-01-11 NOTE — Progress Notes (Signed)
 Established Patient Office Visit     CC/Reason for Visit: Follow-up ED visit for zoster  HPI: [name] is a 68 y.o. female who is coming in today for the above mentioned reasons.  She had to visit the ED while at [facility] due to chest and left arm pain.  She was admitted overnight for what sounds like a standard cardiac workup.  During that time she had an echocardiogram that showed normal ejection fraction no wall motion abnormalities but she did have grade 1 diastolic dysfunction.  She was discharged home the next day.  She then had to go back due to continued pain and was told that maybe she had a pinched nerve in her neck.  The next day after that visit she developed a classic dermatomal blistery, shingles rash over her left arm.  At that point she was prescribed prednisone  and Valtrex and she is now feeling significant relief.  She is requesting a referral to cardiology.   Past Medical/Surgical History: Past Medical History:  Diagnosis Date   Arthritis    Crohn disease (HCC) 07/04/2021   Crohn's disease (HCC)    03-26-15 Prednisone  sarted 2 weeks ago   Foot drop    right foot-wears brace.   Migraine    On prednisone  therapy    started 2 weeks ago   Pancreatitis     Past Surgical History:  Procedure Laterality Date   BOWEL RESECTION     Crohn's disease   COLONOSCOPY     COLONOSCOPY WITH PROPOFOL  N/A 04/02/2015   Procedure: COLONOSCOPY WITH PROPOFOL ;  Surgeon: Garrett Kallman, MD;  Location: [facility];  Service: Endoscopy;  Laterality: N/A;   hip replacement x 5      KNEE SURGERY     scopes   SHOULDER SURGERY     debridement    Social History:  reports that she has never smoked. She has never used smokeless tobacco. She reports current alcohol  use. She reports that she does not use drugs.  Allergies: Allergies  Allergen Reactions   Adalimumab     Other reaction(s): emotional, nose bleeds   Certolizumab Pegol     Other reaction(s): depression    Methotrexate     Other reaction(s): HAs   Sulfasalazine     Other reaction(s): Unknown    Family History:  Family History  Problem Relation Age of Onset   Stroke Mother    Diabetes Mother    COPD Father    Arthritis Father    Colon cancer Neg Hx    Rectal cancer Neg Hx    Stomach cancer Neg Hx      Current Outpatient Medications:    cyclobenzaprine  (FLEXERIL ) 5 MG tablet, TAKE 1 TO 2 TABLETS BY MOUTH EVERY 8 HOURS AS NEEDED FOR MUSCLE SPASM, Disp: , Rfl:    gabapentin (NEURONTIN) 100 MG capsule, Take by mouth., Disp: , Rfl:    ketorolac  (TORADOL ) 10 MG tablet, Take 10 mg by mouth., Disp: , Rfl:    meclizine  (ANTIVERT ) 12.5 MG tablet, Take 1 tablet (12.5 mg total) by mouth 3 (three) times daily as needed for dizziness., Disp: 30 tablet, Rfl: 0   meclizine  (ANTIVERT ) 25 MG tablet, Take 1 tablet (25 mg total) by mouth 3 (three) times daily as needed for dizziness., Disp: 30 tablet, Rfl: 0   promethazine (PHENERGAN) 12.5 MG tablet, Take 12.5 mg by mouth every 6 (six) hours as needed for nausea or vomiting., Disp: , Rfl:  SYRINGE-NEEDLE, DISP, 3 ML (BD SAFETYGLIDE SYRINGE/NEEDLE) 25G X 1" 3 ML MISC, Use for B12 injections, Disp: 100 each, Rfl: 11   topiramate (TOPAMAX) 200 MG tablet, Take 300 mg by mouth at bedtime., Disp: , Rfl:    valACYclovir (VALTREX) 1000 MG tablet, Take 1,000 mg by mouth 3 (three) times daily., Disp: , Rfl:    ZEMBRACE SYMTOUCH  3 MG/0.5ML SOAJ, Inject 1 Dose into the skin daily as needed for migraine., Disp: , Rfl:    cyanocobalamin  (VITAMIN B12) 1000 MCG/ML injection, Inject 1 mL (1,000 mcg total) into the muscle every 30 (thirty) days., Disp: 6 mL, Rfl: 11  Review of Systems:  Negative unless indicated in HPI.   Physical Exam: Vitals:   01/11/24 0943  BP: 130/80  Pulse: 85  Temp: 97.6 F (36.4 C)  TempSrc: Oral  SpO2: 98%  Weight: 156 lb 8 oz (71 kg)    Body mass index is 30.56 kg/m.   Physical Exam Skin:    Comments: Classic zoster rash  over left arm      Impression and Plan:  Vitamin D  deficiency -     VITAMIN D  25 Hydroxy (Vit-D Deficiency, Fractures); Future  Herpes zoster without complication  Chronic diastolic CHF (congestive heart failure) (HCC) -     Ambulatory referral to Cardiology  Other orders -     Cyanocobalamin ; Inject 1 mL (1,000 mcg total) into the muscle every 30 (thirty) days.  Dispense: 6 mL; Refill: 11   - Resolving herpes zoster without complications.  Finish out course of valacyclovir and prednisone  that has been prescribed. - Check vitamin D  levels. - Placed cardiology referral per request due to echocardiogram results of diastolic dysfunction.  Time spent:22 minutes reviewing chart, interviewing and examining patient and formulating plan of care.     Marguerita Shih, MD [facility]

## 2024-01-11 NOTE — Telephone Encounter (Signed)
 Copied from CRM [id number]. Topic: Clinical - Prescription Issue >> Jan 11, 2024  2:02 PM Alysia Jumbo S wrote: Reason for CRM: Patient states that she would like to have her B12 injection and any additional medications  sent to [facility] at [address] [place], [place] [address]

## 2024-01-12 ENCOUNTER — Other Ambulatory Visit: Payer: Self-pay | Admitting: *Deleted

## 2024-01-12 MED ORDER — "BD SAFETYGLIDE SYRINGE/NEEDLE 25G X 1"" 3 ML MISC": 11 refills | Status: AC

## 2024-01-12 MED ORDER — CYANOCOBALAMIN 1000 MCG/ML IJ SOLN: 1000.0000 ug | INTRAMUSCULAR | 11 refills | Status: AC

## 2024-01-12 NOTE — Addendum Note (Signed)
 Addended by: Nicolina Barrios B on: 01/12/2024 07:31 AM   Modules accepted: Orders

## 2024-01-12 NOTE — Telephone Encounter (Signed)
 Refills sent

## 2024-01-13 MED ORDER — VITAMIN D (ERGOCALCIFEROL) 1.25 MG (50000 UNIT) PO CAPS
50000.0000 [IU] | ORAL_CAPSULE | ORAL | 0 refills | Status: AC
Start: 2024-01-13 — End: 2024-03-31

## 2024-02-21 DIAGNOSIS — E663 Overweight: Secondary | ICD-10-CM | POA: Diagnosis not present

## 2024-02-21 DIAGNOSIS — Z6829 Body mass index (BMI) 29.0-29.9, adult: Secondary | ICD-10-CM | POA: Diagnosis not present

## 2024-02-21 DIAGNOSIS — G894 Chronic pain syndrome: Secondary | ICD-10-CM | POA: Diagnosis not present

## 2024-02-21 DIAGNOSIS — K50918 Crohn's disease, unspecified, with other complication: Secondary | ICD-10-CM | POA: Diagnosis not present

## 2024-02-21 DIAGNOSIS — M459 Ankylosing spondylitis of unspecified sites in spine: Secondary | ICD-10-CM | POA: Diagnosis not present

## 2024-02-21 DIAGNOSIS — M1991 Primary osteoarthritis, unspecified site: Secondary | ICD-10-CM | POA: Diagnosis not present

## 2024-03-14 DIAGNOSIS — G43719 Chronic migraine without aura, intractable, without status migrainosus: Secondary | ICD-10-CM | POA: Diagnosis not present

## 2024-03-22 DIAGNOSIS — L309 Dermatitis, unspecified: Secondary | ICD-10-CM | POA: Diagnosis not present

## 2024-03-22 DIAGNOSIS — N898 Other specified noninflammatory disorders of vagina: Secondary | ICD-10-CM | POA: Diagnosis not present

## 2024-03-22 DIAGNOSIS — R21 Rash and other nonspecific skin eruption: Secondary | ICD-10-CM | POA: Diagnosis not present

## 2024-03-29 ENCOUNTER — Other Ambulatory Visit: Payer: Self-pay | Admitting: Internal Medicine

## 2024-03-29 DIAGNOSIS — E559 Vitamin D deficiency, unspecified: Secondary | ICD-10-CM

## 2024-04-13 DIAGNOSIS — M25569 Pain in unspecified knee: Secondary | ICD-10-CM | POA: Diagnosis not present

## 2024-04-21 DIAGNOSIS — M25561 Pain in right knee: Secondary | ICD-10-CM | POA: Diagnosis not present

## 2024-05-02 DIAGNOSIS — R300 Dysuria: Secondary | ICD-10-CM | POA: Diagnosis not present

## 2024-05-02 DIAGNOSIS — N3001 Acute cystitis with hematuria: Secondary | ICD-10-CM | POA: Diagnosis not present

## 2024-05-10 DIAGNOSIS — M1711 Unilateral primary osteoarthritis, right knee: Secondary | ICD-10-CM | POA: Diagnosis not present

## 2024-05-17 ENCOUNTER — Encounter: Payer: Self-pay | Admitting: Gastroenterology

## 2024-05-17 ENCOUNTER — Ambulatory Visit: Admitting: Gastroenterology

## 2024-05-17 ENCOUNTER — Other Ambulatory Visit (INDEPENDENT_AMBULATORY_CARE_PROVIDER_SITE_OTHER)

## 2024-05-17 VITALS — BP 128/64 | HR 69 | Ht 63.0 in | Wt 157.0 lb

## 2024-05-17 DIAGNOSIS — K50012 Crohn's disease of small intestine with intestinal obstruction: Secondary | ICD-10-CM

## 2024-05-17 DIAGNOSIS — K529 Noninfective gastroenteritis and colitis, unspecified: Secondary | ICD-10-CM

## 2024-05-17 DIAGNOSIS — R197 Diarrhea, unspecified: Secondary | ICD-10-CM

## 2024-05-17 DIAGNOSIS — R142 Eructation: Secondary | ICD-10-CM

## 2024-05-17 DIAGNOSIS — K900 Celiac disease: Secondary | ICD-10-CM

## 2024-05-17 LAB — COMPREHENSIVE METABOLIC PANEL WITH GFR
ALT: 11 U/L (ref 0–35)
AST: 13 U/L (ref 0–37)
Albumin: 4.1 g/dL (ref 3.5–5.2)
Alkaline Phosphatase: 71 U/L (ref 39–117)
BUN: 14 mg/dL (ref 6–23)
CO2: 18 meq/L — ABNORMAL LOW (ref 19–32)
Calcium: 8.9 mg/dL (ref 8.4–10.5)
Chloride: 110 meq/L (ref 96–112)
Creatinine, Ser: 0.43 mg/dL (ref 0.40–1.20)
GFR: 99.81 mL/min
Glucose, Bld: 83 mg/dL (ref 70–99)
Potassium: 3.5 meq/L (ref 3.5–5.1)
Sodium: 139 meq/L (ref 135–145)
Total Bilirubin: 0.6 mg/dL (ref 0.2–1.2)
Total Protein: 6.7 g/dL (ref 6.0–8.3)

## 2024-05-17 LAB — CBC WITH DIFFERENTIAL/PLATELET
Basophils Absolute: 0.1 K/uL (ref 0.0–0.1)
Basophils Relative: 1.1 % (ref 0.0–3.0)
Eosinophils Absolute: 0.1 K/uL (ref 0.0–0.7)
Eosinophils Relative: 1.6 % (ref 0.0–5.0)
HCT: 40.7 % (ref 36.0–46.0)
Hemoglobin: 13.5 g/dL (ref 12.0–15.0)
Lymphocytes Relative: 26 % (ref 12.0–46.0)
Lymphs Abs: 1.8 K/uL (ref 0.7–4.0)
MCHC: 33.2 g/dL (ref 30.0–36.0)
MCV: 96.6 fl (ref 78.0–100.0)
Monocytes Absolute: 0.6 K/uL (ref 0.1–1.0)
Monocytes Relative: 8.9 % (ref 3.0–12.0)
Neutro Abs: 4.4 K/uL (ref 1.4–7.7)
Neutrophils Relative %: 62.4 % (ref 43.0–77.0)
Platelets: 206 K/uL (ref 150.0–400.0)
RBC: 4.21 Mil/uL (ref 3.87–5.11)
RDW: 13.5 % (ref 11.5–15.5)
WBC: 7 K/uL (ref 4.0–10.5)

## 2024-05-17 LAB — B12 AND FOLATE PANEL
Folate: 8.2 ng/mL (ref >5.9)
Vitamin B-12: 438 pg/mL (ref 211–911)

## 2024-05-17 LAB — SEDIMENTATION RATE: Sed Rate: 6 mm/h (ref 0–30)

## 2024-05-17 MED ORDER — METRONIDAZOLE 500 MG PO TABS: 500.0000 mg | ORAL_TABLET | Freq: Two times a day (BID) | ORAL | 0 refills | Status: AC

## 2024-05-17 NOTE — Progress Notes (Signed)
 [facility] Progress Note  Chief Complaint: Abdominal pain, bloating and diarrhea  Subjective  Prior history  Clinical details of Crohn's disease diagnosis decades ago outlined in my February 2023 office consult note.  Ileocecectomy decades ago, subsequently followed by Dr. Milderd Louder of [facility] with periodic colonoscopy, on no Crohn's specific therapy. Multiple hospitalizations over the years for small bowel obstructions, most recently in 2023. Colonoscopy late March 2023 showed an area of abnormal scarred and tethered mucosa in the transverse colon but no active inflammation (which coincided with an area of concern on CT scan).  There was an ilio colonic anastomosis in the a sending with an ulcerated inflamed and stenotic anastomosis that could not be passed with the scope.  The limited visualization of the neo-TI did not seem to show any Crohn's activity there, and subsequent CTE was done.  [name] was advised that she should be on biologic therapy for her Crohn's to decrease the chance of further inflammatory stricturing at the anastomosis that may result in further bowel obstruction and the need for surgery.  She declined at that time.  Discussed the use of AI scribe software for clinical note transcription with the patient, who gave verbal consent to proceed.  History of Present Illness [name] is a 68 year old female with Crohn's disease who presents with bloating and diarrhea.  She has been experiencing bloating and changes in bowel habits over the past three to four months. Her stools are watery and unformed, occurring two to three times daily.  While she would previously have occasional loose stool, this is decidedly a change for her.  No blood in stools, nausea, or vomiting, but there is occasional gassiness. She has been using more Imodium than preferred to manage these symptoms.  She has a history of Crohn's disease, which previously involved the small bowel and  required surgery. She has not been hospitalized for bowel obstruction since her last visit. She experiences joint pain related to osteoarthritis, affecting her ankles, elbows, neck, and fingers, particularly during cold weather or rain. [name] has been seeing Dr.Hawkes of rheumatology for her arthralgias, and it seems that she has tried anti-TNF treatments in the past (adalimumab and certolizumab) as well as methotrexate and sulfasalazine because these are all listed on her medication allergies/adverse effects list.  She had not taken any of those or any other specific meds for Crohn's disease.  [name] sees her for osteoarthritis (by her report), M I am unable to read those office notes, diagnoses listed in encounter review suggest there may be ankylosing spondylitis as well. She tells me that in the last conversation with Dr. Ishmael, the suggestion was made that perhaps Entyvio could be considered for her Crohn's and that might also help her arthralgias.  However I explained to [name] that Entyvio is uncommonly effective for small bowel Crohn's. Additionally, she reports a persistent cough that lasts for hours and recurs frequently, though she has not sought evaluation from a pulmonologist.  She plans to see a new primary care provider soon because she has moved to another town, and I recommended she bring this up with them and request a chest x-ray and perhaps referral to a pulmonologist as well.    ROS: Cardiovascular:  no chest pain Respiratory: no dyspnea.  Chronic cough Arthralgias  The patient's Past Medical, Family and Social History were reviewed and are on file in the EMR. Past Medical History:  Diagnosis Date   Arthritis  Crohn disease (HCC) 07/04/2021   Crohn's disease (HCC)    03-26-15 Prednisone  sarted 2 weeks ago   Foot drop    right foot-wears brace.   Migraine    On prednisone  therapy    started 2 weeks ago   Pancreatitis    Shingles    Sees Hawkes from Rheum for OA  and AS (based on encounter Dx, no note avalilable)  Past Surgical History:  Procedure Laterality Date   BOWEL RESECTION     Crohn's disease   COLONOSCOPY     COLONOSCOPY WITH PROPOFOL  N/A 04/02/2015   Procedure: COLONOSCOPY WITH PROPOFOL ;  Surgeon: Gladis MARLA Louder, MD;  Location: [facility];  Service: Endoscopy;  Laterality: N/A;   hip replacement x 5      KNEE SURGERY     scopes   SHOULDER SURGERY     debridement     Objective:  Med list reviewed  Current Outpatient Medications:    cyanocobalamin  (VITAMIN B12) 1000 MCG/ML injection, Inject 1 mL (1,000 mcg total) into the muscle every 30 (thirty) days., Disp: 6 mL, Rfl: 11   meclizine  (ANTIVERT ) 25 MG tablet, Take 1 tablet (25 mg total) by mouth 3 (three) times daily as needed for dizziness., Disp: 30 tablet, Rfl: 0   metroNIDAZOLE  (FLAGYL ) 500 MG tablet, Take 1 tablet (500 mg total) by mouth 2 (two) times daily for 10 days., Disp: 20 tablet, Rfl: 0   promethazine (PHENERGAN) 12.5 MG tablet, Take 12.5 mg by mouth every 6 (six) hours as needed for nausea or vomiting., Disp: , Rfl:    SYRINGE-NEEDLE, DISP, 3 ML (BD SAFETYGLIDE SYRINGE/NEEDLE) 25G X 1 3 ML MISC, Use for B12 injections, Disp: 100 each, Rfl: 11   topiramate (TOPAMAX) 200 MG tablet, Take 300 mg by mouth at bedtime., Disp: , Rfl:    valACYclovir (VALTREX) 1000 MG tablet, Take 1,000 mg by mouth 3 (three) times daily., Disp: , Rfl:    Vital signs in last 24 hrs: Vitals:   05/17/24 1448  BP: 128/64  Pulse: 69   Wt Readings from Last 3 Encounters:  05/17/24 157 lb (71.2 kg)  01/11/24 156 lb 8 oz (71 kg)  03/19/23 138 lb (62.6 kg)  (Weight gain since last visit here) Physical Exam  Well-appearing, good muscle mass HEENT: sclera anicteric, oral mucosa moist without lesions Neck: supple, no thyromegaly, JVD or lymphadenopathy Cardiac: Regular without appreciable murmur,  no peripheral edema Pulm: clear to auscultation bilaterally, normal RR and effort  noted Abdomen: soft, no focal tenderness, with active bowel sounds of normal character. No guarding or palpable hepatosplenomegaly.  No distention Skin; warm and dry, no jaundice or rash   Labs:  Last labs in this EHR from July 2024 except low Vit D May 2025 ___________________________________________ Radiologic studies:   ____________________________________________ Other:   _____________________________________________   Encounter Diagnoses  Name Primary?   Crohn's disease of small intestine with intestinal obstruction (HCC) Yes   Chronic diarrhea     Assessment and Plan Assessment & Plan Chronic diarrhea and bloating, evaluation for etiology including Crohn's disease activity, small intestinal bacterial overgrowth, celiac disease, and Clostridioides difficile infection   Small intestinal bacterial overgrowth considered due to previous bowel surgery. Celiac disease diagnosed with blood antibody testing. Clostridioides difficile infection more common in inflammatory bowel disease.   - Order blood tests for celiac disease and inflammation markers.  (Sed rate, celiac antibodies, CBC, CMP, ESR, B12) - Order stool tests for Clostridioides difficile and calprotectin. - Prescribe a short  course of metronidazole  (Flagyl ) for suspected small intestinal bacterial overgrowth. - If stool tests are negative for Clostridioides difficile and no improvement with metronidazole , consider further evaluation for Crohn's disease activity.   - Evaluate results of blood and stool tests to assess for Crohn's disease activity. - Consider colonoscopy if tests suggest ongoing inflammation or if symptoms persist.  Follow-up with APP in 6 weeks, call sooner if needed  Sending my note to Dr. Ishmael and hoping her future notes can be forwarded to me as well.    35 minutes were spent on this encounter (including chart review, history/exam, counseling/coordination of care, and documentation) > 50% of  that time was spent on counseling and coordination of care.   Victory LITTIE Brand III

## 2024-05-17 NOTE — Patient Instructions (Addendum)
 Your provider has requested that you go to the basement level for lab work before leaving today. Press B on the elevator. The lab is located at the first door on the left as you exit the elevator.   We have sent the following medications to your pharmacy for you to pick up at your convenience: Flagyl   _______________________________________________________  If your blood pressure at your visit was 140/90 or greater, please contact your primary care physician to follow up on this.  _______________________________________________________  If you are age 4 or older, your body mass index should be between 23-30. Your Body mass index is 27.81 kg/m. If this is out of the aforementioned range listed, please consider follow up with your Primary Care Provider.  If you are age 60 or younger, your body mass index should be between 19-25. Your Body mass index is 27.81 kg/m. If this is out of the aformentioned range listed, please consider follow up with your Primary Care Provider.   ________________________________________________________  The [facility] providers would like to encourage you to use MYCHART to communicate with providers for non-urgent requests or questions.  Due to long hold times on the telephone, sending your provider a message by [facility] may be a faster and more efficient way to get a response.  Please allow 48 business hours for a response.  Please remember that this is for non-urgent requests.  _______________________________________________________  [facility] is using a team-based approach to care.  Your team is made up of your doctor and two to three APPS. Our APPS (Nurse Practitioners and Physician Assistants) work with your physician to ensure care continuity for you. They are fully qualified to address your health concerns and develop a treatment plan. They communicate directly with your gastroenterologist to care for you. Seeing the Advanced Practice  Practitioners on your physician's team can help you by facilitating care more promptly, often allowing for earlier appointments, access to diagnostic testing, procedures, and other specialty referrals.    Thank you for trusting me with your gastrointestinal care!    Dr. Victory Legrand DOUGLAS [facility]

## 2024-05-18 LAB — IGA: Immunoglobulin A: 447 mg/dL — ABNORMAL HIGH (ref 70–320)

## 2024-05-18 LAB — TISSUE TRANSGLUTAMINASE, IGA: (tTG) Ab, IgA: <1 U/mL

## 2024-05-22 ENCOUNTER — Ambulatory Visit: Payer: Self-pay | Admitting: Gastroenterology

## 2024-05-23 ENCOUNTER — Other Ambulatory Visit

## 2024-05-23 DIAGNOSIS — K529 Noninfective gastroenteritis and colitis, unspecified: Secondary | ICD-10-CM | POA: Diagnosis not present

## 2024-05-23 DIAGNOSIS — K50012 Crohn's disease of small intestine with intestinal obstruction: Secondary | ICD-10-CM | POA: Diagnosis not present

## 2024-05-24 LAB — CLOSTRIDIUM DIFFICILE BY PCR: Toxigenic C. Difficile by PCR: NEGATIVE

## 2024-05-24 LAB — SPECIMEN STATUS REPORT

## 2024-05-25 LAB — CALPROTECTIN, FECAL: Calprotectin, Fecal: 31 ug/g (ref 0–120)

## 2024-05-30 DIAGNOSIS — N958 Other specified menopausal and perimenopausal disorders: Secondary | ICD-10-CM | POA: Diagnosis not present

## 2024-05-30 DIAGNOSIS — R3989 Other symptoms and signs involving the genitourinary system: Secondary | ICD-10-CM | POA: Diagnosis not present

## 2024-05-31 DIAGNOSIS — E663 Overweight: Secondary | ICD-10-CM | POA: Diagnosis not present

## 2024-05-31 DIAGNOSIS — R1024 Suprapubic pain: Secondary | ICD-10-CM | POA: Diagnosis not present

## 2024-05-31 DIAGNOSIS — K5080 Crohn's disease of both small and large intestine without complications: Secondary | ICD-10-CM | POA: Diagnosis not present

## 2024-05-31 DIAGNOSIS — Z1330 Encounter for screening examination for mental health and behavioral disorders, unspecified: Secondary | ICD-10-CM | POA: Diagnosis not present

## 2024-05-31 DIAGNOSIS — R053 Chronic cough: Secondary | ICD-10-CM | POA: Diagnosis not present

## 2024-06-30 ENCOUNTER — Ambulatory Visit: Admitting: Gastroenterology

## 2024-07-04 DIAGNOSIS — E663 Overweight: Secondary | ICD-10-CM | POA: Diagnosis not present

## 2024-07-04 DIAGNOSIS — M25572 Pain in left ankle and joints of left foot: Secondary | ICD-10-CM | POA: Diagnosis not present

## 2024-07-04 DIAGNOSIS — K50918 Crohn's disease, unspecified, with other complication: Secondary | ICD-10-CM | POA: Diagnosis not present

## 2024-07-04 DIAGNOSIS — Z6829 Body mass index (BMI) 29.0-29.9, adult: Secondary | ICD-10-CM | POA: Diagnosis not present

## 2024-07-04 DIAGNOSIS — M459 Ankylosing spondylitis of unspecified sites in spine: Secondary | ICD-10-CM | POA: Diagnosis not present

## 2024-07-04 DIAGNOSIS — M1991 Primary osteoarthritis, unspecified site: Secondary | ICD-10-CM | POA: Diagnosis not present

## 2024-07-04 DIAGNOSIS — G894 Chronic pain syndrome: Secondary | ICD-10-CM | POA: Diagnosis not present

## 2024-07-04 DIAGNOSIS — R5383 Other fatigue: Secondary | ICD-10-CM | POA: Diagnosis not present

## 2024-07-06 DIAGNOSIS — R0602 Shortness of breath: Secondary | ICD-10-CM | POA: Diagnosis not present

## 2024-07-11 ENCOUNTER — Ambulatory Visit: Admitting: Gastroenterology

## 2024-07-11 ENCOUNTER — Encounter: Payer: Self-pay | Admitting: Gastroenterology

## 2024-07-11 VITALS — BP 134/82 | HR 91 | Ht 61.0 in | Wt 159.0 lb

## 2024-07-11 DIAGNOSIS — K50012 Crohn's disease of small intestine with intestinal obstruction: Secondary | ICD-10-CM

## 2024-07-11 DIAGNOSIS — R0609 Other forms of dyspnea: Secondary | ICD-10-CM

## 2024-07-11 DIAGNOSIS — R600 Localized edema: Secondary | ICD-10-CM

## 2024-07-11 DIAGNOSIS — K529 Noninfective gastroenteritis and colitis, unspecified: Secondary | ICD-10-CM

## 2024-07-11 MED ORDER — RIFAXIMIN 550 MG PO TABS: 550.0000 mg | ORAL_TABLET | Freq: Three times a day (TID) | ORAL | 0 refills | Status: DC

## 2024-07-11 NOTE — Patient Instructions (Addendum)
 _______________________________________________________  If your blood pressure at your visit was 140/90 or greater, please contact your primary care physician to follow up on this.  _______________________________________________________  If you are age 68 or older, your body mass index should be between 23-30. Your Body mass index is 30.04 kg/m. If this is out of the aforementioned range listed, please consider follow up with your Primary Care Provider.  If you are age 13 or younger, your body mass index should be between 19-25. Your Body mass index is 30.04 kg/m. If this is out of the aformentioned range listed, please consider follow up with your Primary Care Provider.   ________________________________________________________  The [facility] providers would like to encourage you to use MYCHART to communicate with providers for non-urgent requests or questions.  Due to long hold times on the telephone, sending your provider a message by [facility] may be a faster and more efficient way to get a response.  Please allow 48 business hours for a response.  Please remember that this is for non-urgent requests.  _______________________________________________________  [facility] is using a team-based approach to care.  Your team is made up of your doctor and two to three APPS. Our APPS (Nurse Practitioners and Physician Assistants) work with your physician to ensure care continuity for you. They are fully qualified to address your health concerns and develop a treatment plan. They communicate directly with your gastroenterologist to care for you. Seeing the Advanced Practice Practitioners on your physician's team can help you by facilitating care more promptly, often allowing for earlier appointments, access to diagnostic testing, procedures, and other specialty referrals.   We have sent the following medications to your pharmacy for you to pick up at your convenience:   You have been  scheduled for a ct enterography at [facility] (First floor of hospital)  on 07-18-24  at  1:30pm.  Please arrive 1 hour and 30 minutes prior to scheduled appointment time. Nothing by mouth/Clear liquids 4 hours prior to this appointment. If you have any questions please call [phone].   You have been scheduled for an appointment with Dr. Legrand on 08-16-24 at 220pm . Please arrive 10 minutes early for your appointment.   It was a pleasure to see you today!  Thank you for trusting me with your gastrointestinal care!

## 2024-07-11 NOTE — Progress Notes (Addendum)
 [name] [id number] 1955/11/01   Chief Complaint: Follow up  Referring Provider: Theophilus Andrews, [place]* Primary GI MD: Dr. Legrand  HPI: [name] is a 68 y.o. female with past medical history of Crohn's disease s/p ileocecetomy, migraines, pancreatitis, arthritis who presents today for follow up.   Clinical details of Crohn's disease diagnosis decades ago outlined in February 2023 office consult note.  Ileocecectomy decades ago, subsequently followed by Dr. Milderd Louder of [facility] with periodic colonoscopy, on no Crohn's specific therapy. Multiple hospitalizations over the years for small bowel obstructions, most recently in 2023. Colonoscopy late March 2023 showed an area of abnormal scarred and tethered mucosa in the transverse colon but no active inflammation (which coincided with an area of concern on CT scan).  There was an iliocolonic anastomosis in the ascending with an ulcerated inflamed and stenotic anastomosis that could not be passed with the scope.  The limited visualization of the neo-TI did not seem to show any Crohn's activity there, and subsequent CTE was done.  [name] was advised that she should be on biologic therapy for her Crohn's to decrease the chance of further inflammatory stricturing at the anastomosis that may result in further bowel obstruction and the need for surgery.  She declined at that time.  Last seen in office 05/17/2024 by Dr. Legrand for bloating and diarrhea onset over the previous 3 to 4 months.  Stools were watery and unformed occurring 2-3 times daily which was a change for her.  Had been seeing rheumatology for arthralgias and had tried anti-TNF treatments in the past (adalimumab and certolizumab) as well as methotrexate and sulfasalazine (all listed in her medication allergy/adverse effects list).  Suggestion was made to consider Entyvio, however it was explained to patient that Entyvio is uncommonly effective for small bowel  Crohn's. She was advised to see her PCP regarding a chronic cough. Plan at last visit was to update labs, order stool test for C. difficile and fecal calprotectin, and prescribe a short course of Flagyl  for suspected SIBO.  If stool test negative for C. difficile and no improvement with Flagyl , consider further evaluation with colonoscopy for Crohn's disease activity.  Labs 05/17/2024: Normal CBC, CMP.  Negative celiac antibody.  Normal vitamin B12 and folic acid .  Sed rate normal.  Negative C. difficile PCR, normal fecal calprotectin.  Saw PCP 07/06/2024 for shortness of breath on exertion and leg swelling.  Referred to cardiology.   Discussed the use of AI scribe software for clinical note transcription with the patient, who gave verbal consent to proceed.  History of Present Illness [name] is a 68 year old female with Crohn's disease who presents for follow-up of gastrointestinal symptoms.  Chronic diarrhea and bowel habit changes - Crohn's disease with persistent loose stools since September - Stool studies and laboratory tests in September were normal - Initiated on metronidazole ; stools have remained consistently loose since then - Stools lack form, occasionally watery - No hematochezia or melena - No abdominal pain, fever, or chills - Imodium used as needed, especially when going out, with symptomatic improvement and no constipation - Typical frequency is two to three bowel movements per day, influenced by diet - Avoids milk products, ice cream, and nuts due to symptom exacerbation - Occasional urgency without rectal pain  Dyspnea and peripheral edema - Shortness of breath and bilateral leg swelling for the past six to eight months - No chest pain - Not currently taking prednisone  or diuretics - Recent  COVID-19 infection diagnosed three to four weeks ago, but respiratory symptoms predated infection  Unintentional weight gain - Weight gain of approximately 25 pounds  over the past year - No significant changes in diet or activity level - Slight decrease in activity level  Musculoskeletal pain and vertigo - Arthralgia, worsened by cold weather - Vertigo with dizziness - No current use of diuretics   Previous GI Procedures/Imaging   CT enterography abdomen/pelvis 12/30/2021 1. No acute findings in the abdomen or pelvis. Specifically, no evidence for abnormal mucosal or transmural hyperenhancement within the stomach, small bowel, or colon. No inflammatory or fibrous small bowel stricture. 2. 2.7 cm simple appearing cyst left ovary. Consensus guidelines suggest no follow-up imaging warranted. Note: This recommendation excludes cysts stable ? 2 years, cysts previously characterized by US  or MR, corpus luteum cysts and adnexal calcifications without associated soft tissue mass. This recommendation does not apply to premenarchal patients and to those with increased risk (genetic, family history, elevated tumor markers or other high-risk factors) of ovarian cancer.  Colonoscopy 11/26/2021 - Patent end- to- side ileo- colonic anastomosis, characterized by edema, erythema, friable mucosa and ulceration. Biopsied.  - Scarred mucosa in the transverse colon.  - Internal hemorrhoids.  - The examination was otherwise normal on direct and retroflexion views.   Past Medical History:  Diagnosis Date   Arthritis    Crohn disease (HCC) 07/04/2021   Crohn's disease (HCC)    03-26-15 Prednisone  sarted 2 weeks ago   Foot drop    right foot-wears brace.   Migraine    On prednisone  therapy    started 2 weeks ago   Pancreatitis    Shingles     Past Surgical History:  Procedure Laterality Date   BOWEL RESECTION     Crohn's disease   COLONOSCOPY     COLONOSCOPY WITH PROPOFOL  N/A 04/02/2015   Procedure: COLONOSCOPY WITH PROPOFOL ;  Surgeon: Gladis MARLA Louder, MD;  Location: [facility];  Service: Endoscopy;  Laterality: N/A;   hip replacement x 5      KNEE  SURGERY     scopes   SHOULDER SURGERY     debridement    Current Outpatient Medications  Medication Sig Dispense Refill   cyanocobalamin  (VITAMIN B12) 1000 MCG/ML injection Inject 1 mL (1,000 mcg total) into the muscle every 30 (thirty) days. 6 mL 11   meclizine  (ANTIVERT ) 25 MG tablet Take 1 tablet (25 mg total) by mouth 3 (three) times daily as needed for dizziness. 30 tablet 0   promethazine (PHENERGAN) 12.5 MG tablet Take 12.5 mg by mouth every 6 (six) hours as needed for nausea or vomiting.     SYRINGE-NEEDLE, DISP, 3 ML (BD SAFETYGLIDE SYRINGE/NEEDLE) 25G X 1 3 ML MISC Use for B12 injections 100 each 11   topiramate (TOPAMAX) 200 MG tablet Take 300 mg by mouth at bedtime.     No current facility-administered medications for this visit.    Allergies as of 07/11/2024 - Review Complete 07/11/2024  Allergen Reaction Noted   Adalimumab  11/18/2021   Certolizumab pegol  11/18/2021   Methotrexate  11/18/2021   Sulfasalazine  11/18/2021    Family History  Problem Relation Age of Onset   Stroke Mother    Diabetes Mother    COPD Father    Arthritis Father    Colon cancer Neg Hx    Rectal cancer Neg Hx    Stomach cancer Neg Hx     Social History   Tobacco Use  Smoking status: Never   Smokeless tobacco: Never  Substance Use Topics   Alcohol  use: Yes    Comment: social    Drug use: No     Review of Systems:    Constitutional: No weight loss, fever, chills Cardiovascular: No chest pain Respiratory: Positive for shortness of breath  Gastrointestinal: See HPI and otherwise negative   Physical Exam:  Vital signs: BP 134/82   Pulse 91   Ht 5' 1 (1.549 m)   Wt 159 lb (72.1 kg)   SpO2 96%   BMI 30.04 kg/m   Constitutional: Mild pleasant, overweight female in NAD, alert and cooperative Head:  Normocephalic and atraumatic.  Eyes: No scleral icterus.  Respiratory: Respirations even and unlabored. Lungs clear to auscultation bilaterally.  No wheezes, crackles, or  rhonchi.  Cardiovascular:  Regular rate and rhythm. No murmurs. Mild bilateral nonpitting LE edema. Gastrointestinal:  Soft, nondistended, nontender. No rebound or guarding. Normal bowel sounds. No appreciable masses or hepatomegaly. Rectal:  Not performed.  Neurologic:  Alert and oriented x4;  grossly normal neurologically.  Skin:   Dry and intact without significant lesions or rashes. Psychiatric: Oriented to person, place and time. Demonstrates good judgement and reason without abnormal affect or behaviors.   RELEVANT LABS AND IMAGING: CBC    Component Value Date/Time   WBC 7.0 05/17/2024 1550   RBC 4.21 05/17/2024 1550   HGB 13.5 05/17/2024 1550   HCT 40.7 05/17/2024 1550   PLT 206.0 05/17/2024 1550   MCV 96.6 05/17/2024 1550   MCH 31.4 03/12/2023 1301   MCHC 33.2 05/17/2024 1550   RDW 13.5 05/17/2024 1550   LYMPHSABS 1.8 05/17/2024 1550   MONOABS 0.6 05/17/2024 1550   EOSABS 0.1 05/17/2024 1550   BASOSABS 0.1 05/17/2024 1550    CMP     Component Value Date/Time   NA 139 05/17/2024 1550   K 3.5 05/17/2024 1550   CL 110 05/17/2024 1550   CO2 18 (L) 05/17/2024 1550   GLUCOSE 83 05/17/2024 1550   BUN 14 05/17/2024 1550   CREATININE 0.43 05/17/2024 1550   CREATININE 0.40 (L) 06/26/2020 0850   CALCIUM 8.9 05/17/2024 1550   PROT 6.7 05/17/2024 1550   ALBUMIN 4.1 05/17/2024 1550   AST 13 05/17/2024 1550   ALT 11 05/17/2024 1550   ALKPHOS 71 05/17/2024 1550   BILITOT 0.6 05/17/2024 1550   GFRNONAA >60 03/12/2023 1301   GFRAA >60 05/19/2017 9161     Assessment/Plan:   Assessment & Plan Crohn's disease with chronic loose stools Chronic loose stools, ineffective metronidazole  treatment, hesitant about colonoscopy, open to CT scan.  Case and plan discussed with Dr. Legrand in office today.  - Order CT enterography abdomen/pelvis - Start Xifaxan 550 mg 3 times daily for 14 days for possible IBS-D/SIBO - Follow up with Dr. Legrand in one month  Lower extremity edema  and shortness of breath Persistent shortness of breath and bilateral lower extremity edema, ongoing for about 6 to 8 months, has cardiology referral and will be seeing them later this month.  - Proceed with cardiology appointment on November 17th.   Camie Furbish, PA-C [facility] 07/11/2024, 11:00 AM  Patient Care Team: Theophilus Andrews, Tully GRADE, MD as PCP - General (Internal Medicine)

## 2024-07-13 ENCOUNTER — Encounter: Payer: Self-pay | Admitting: Gastroenterology

## 2024-07-13 ENCOUNTER — Telehealth: Payer: Self-pay | Admitting: Gastroenterology

## 2024-07-13 NOTE — Telephone Encounter (Signed)
 [name], This patient needs a follow-up office visit with Dr. Legrand in December.  I discussed case with him in office on day of her visit and he advised that we could put her in a hemorrhoid banding slot if needed.  Will you please see what he has available and try to get her scheduled? Thank you.

## 2024-07-13 NOTE — Telephone Encounter (Signed)
 Dr Legrand doesn't have anything available except 7 day holds in December. And if I remember correctly we can only schedule those 7 days out and the nurse has to do it. If he is okay with that I can pass the msg along to one of his nurses to hold one for her and call her to schedule when the time comes.

## 2024-07-14 ENCOUNTER — Other Ambulatory Visit: Payer: Self-pay

## 2024-07-14 MED ORDER — RIFAXIMIN 550 MG PO TABS: 550.0000 mg | ORAL_TABLET | Freq: Three times a day (TID) | ORAL | 0 refills | Status: AC

## 2024-07-14 NOTE — Telephone Encounter (Signed)
 Pt was scheduled for a 7 day hold appt during her OV on 11/11. She will return to see Dr Legrand on 08/16/24

## 2024-07-17 ENCOUNTER — Other Ambulatory Visit (HOSPITAL_COMMUNITY): Payer: Self-pay

## 2024-07-17 ENCOUNTER — Telehealth: Payer: Self-pay

## 2024-07-17 DIAGNOSIS — R072 Precordial pain: Secondary | ICD-10-CM | POA: Diagnosis not present

## 2024-07-17 DIAGNOSIS — R0602 Shortness of breath: Secondary | ICD-10-CM | POA: Diagnosis not present

## 2024-07-17 DIAGNOSIS — R9431 Abnormal electrocardiogram [ECG] [EKG]: Secondary | ICD-10-CM | POA: Diagnosis not present

## 2024-07-17 NOTE — Telephone Encounter (Signed)
 Pharmacy Patient Advocate Encounter   Received notification from Patient Pharmacy that prior authorization for Xifaxan 550MG  tablet is required/requested.   Insurance verification completed.   The patient is insured through [employer].   Per test claim: xxx

## 2024-07-18 ENCOUNTER — Ambulatory Visit (HOSPITAL_COMMUNITY)
Admission: RE | Admit: 2024-07-18 | Discharge: 2024-07-18 | Disposition: A | Discharge status: Home / Self Care | Source: Ambulatory Visit | Attending: Gastroenterology | Admitting: Gastroenterology

## 2024-07-18 DIAGNOSIS — K50012 Crohn's disease of small intestine with intestinal obstruction: Secondary | ICD-10-CM | POA: Diagnosis not present

## 2024-07-18 DIAGNOSIS — Z90710 Acquired absence of both cervix and uterus: Secondary | ICD-10-CM | POA: Diagnosis not present

## 2024-07-18 DIAGNOSIS — K529 Noninfective gastroenteritis and colitis, unspecified: Secondary | ICD-10-CM | POA: Diagnosis not present

## 2024-07-18 DIAGNOSIS — K5090 Crohn's disease, unspecified, without complications: Secondary | ICD-10-CM | POA: Diagnosis not present

## 2024-07-18 MED ORDER — SODIUM CHLORIDE (PF) 0.9 % IJ SOLN
INTRAMUSCULAR | Status: AC
  Filled 2024-07-18: qty 50

## 2024-07-18 MED ORDER — IOHEXOL 300 MG/ML  SOLN
100.0000 mL | Freq: Once | INTRAMUSCULAR | Status: AC | PRN
  Administered 2024-07-18: 100 mL via INTRAVENOUS

## 2024-07-18 NOTE — Telephone Encounter (Signed)
 Documentation will need to be updated as it does not currently show any indication of this diagnosis.

## 2024-07-20 ENCOUNTER — Other Ambulatory Visit (HOSPITAL_COMMUNITY): Payer: Self-pay

## 2024-07-20 NOTE — Telephone Encounter (Signed)
 Pharmacy Patient Advocate Encounter    Per test claim: PA required; PA submitted to above mentioned insurance via Latent Key/confirmation #/EOC [facility] Status is pending

## 2024-07-20 NOTE — Telephone Encounter (Signed)
 Pharmacy Patient Advocate Encounter  Received notification from [facility] Medicare that Prior Authorization for Xifaxan  550MG  tablets has been APPROVED from 07-20-2024 to 08-03-2024   PA #/Case ID/Reference #: [facility]

## 2024-07-21 DIAGNOSIS — M1711 Unilateral primary osteoarthritis, right knee: Secondary | ICD-10-CM | POA: Diagnosis not present

## 2024-07-24 ENCOUNTER — Ambulatory Visit: Payer: Self-pay | Admitting: Gastroenterology

## 2024-08-04 ENCOUNTER — Ambulatory Visit: Admitting: Gastroenterology

## 2024-08-07 NOTE — Progress Notes (Signed)
 ____________________________________________________________  Attending physician addendum:  Thank you for sending this case to me after discussing with me in clinic that day.  (Note was just routed to me today after having initially gone to Dr. Avram) I have reviewed the entire note and agree with the plan.  Looks like CT enterography does not show any apparent inflammation in the neo-TI Hopefully the rifaximin  helped her out She had a follow-up appointment with me recently that she appears to have canceled, looks like you asked clinical staff to reach out to her to make a visit with me.  Victory Brand, MD  ____________________________________________________________

## 2024-08-08 DIAGNOSIS — R0602 Shortness of breath: Secondary | ICD-10-CM | POA: Diagnosis not present

## 2024-08-08 DIAGNOSIS — R072 Precordial pain: Secondary | ICD-10-CM | POA: Diagnosis not present

## 2024-08-15 DIAGNOSIS — R0602 Shortness of breath: Secondary | ICD-10-CM | POA: Diagnosis not present

## 2024-08-15 DIAGNOSIS — R635 Abnormal weight gain: Secondary | ICD-10-CM | POA: Diagnosis not present

## 2024-08-16 ENCOUNTER — Encounter: Payer: Self-pay | Admitting: Gastroenterology

## 2024-08-16 ENCOUNTER — Ambulatory Visit: Admitting: Gastroenterology

## 2024-08-16 VITALS — BP 126/76 | HR 73 | Ht 60.0 in | Wt 161.0 lb

## 2024-08-16 DIAGNOSIS — K529 Noninfective gastroenteritis and colitis, unspecified: Secondary | ICD-10-CM | POA: Diagnosis not present

## 2024-08-16 DIAGNOSIS — K50012 Crohn's disease of small intestine with intestinal obstruction: Secondary | ICD-10-CM | POA: Diagnosis not present

## 2024-08-16 NOTE — Patient Instructions (Addendum)
 _______________________________________________________  If your blood pressure at your visit was 140/90 or greater, please contact your primary care physician to follow up on this.  _______________________________________________________  If you are age 68 or older, your body mass index should be between 23-30. Your Body mass index is 31.44 kg/m. If this is out of the aforementioned range listed, please consider follow up with your Primary Care Provider.  If you are age 87 or younger, your body mass index should be between 19-25. Your Body mass index is 31.44 kg/m. If this is out of the aformentioned range listed, please consider follow up with your Primary Care Provider.   ________________________________________________________  The [facility] providers would like to encourage you to use MYCHART to communicate with providers for non-urgent requests or questions.  Due to long hold times on the telephone, sending your provider a message by [facility] may be a faster and more efficient way to get a response.  Please allow 48 business hours for a response.  Please remember that this is for non-urgent requests.  _______________________________________________________  [facility] is using a team-based approach to care.  Your team is made up of your doctor and two to three APPS. Our APPS (Nurse Practitioners and Physician Assistants) work with your physician to ensure care continuity for you. They are fully qualified to address your health concerns and develop a treatment plan. They communicate directly with your gastroenterologist to care for you. Seeing the Advanced Practice Practitioners on your physician's team can help you by facilitating care more promptly, often allowing for earlier appointments, access to diagnostic testing, procedures, and other specialty referrals.   It was a pleasure to see you today!  Thank you for trusting me with your gastrointestinal care!

## 2024-08-16 NOTE — Progress Notes (Signed)
 [facility] Progress Note  Chief Complaint: Chronic diarrhea  Subjective  Prior history  Clinical details of Crohn's disease diagnosis decades ago outlined in February 2023 office consult note.  Ileocecectomy decades ago, subsequently followed by Dr. Milderd Louder of [facility] with periodic colonoscopy, on no Crohn's specific therapy. Multiple hospitalizations over the years for small bowel obstructions, most recently in 2023. Colonoscopy late March 2023 showed an area of abnormal scarred and tethered mucosa in the transverse colon but no active inflammation (which coincided with an area of concern on CT scan).  There was an iliocolonic anastomosis in the ascending with an ulcerated inflamed and stenotic anastomosis that could not be passed with the scope.  The limited visualization of the neo-TI did not seem to show any Crohn's activity there, and subsequent CTE was done.  [name] was advised that she should be on biologic therapy for her Crohn's to decrease the chance of further inflammatory stricturing at the anastomosis that may result in further bowel obstruction and the need for surgery.  She declined at that time.   seen in office 05/17/2024 by Dr. Legrand for bloating and diarrhea onset over the previous 3 to 4 months.  Stools were watery and unformed occurring 2-3 times daily which was a change for her.  Had been seeing rheumatology for arthralgias and had tried anti-TNF treatments in the past (adalimumab and certolizumab) as well as methotrexate and sulfasalazine (all listed in her medication allergy/adverse effects list).  Suggestion was made to consider Entyvio, however it was explained to patient that Entyvio is uncommonly effective for small bowel Crohn's. She was advised to see her PCP regarding a chronic cough. Plan at last visit was to update labs, order stool test for C. difficile and fecal calprotectin, and prescribe a short course of Flagyl  for suspected SIBO.  If stool test  negative for C. difficile and no improvement with Flagyl , consider further evaluation with colonoscopy for Crohn's disease activity.  Labs 05/17/2024: Normal CBC, CMP.  Negative celiac antibody.  Normal vitamin B12 and folic acid .  Sed rate normal.  Negative C. difficile PCR, normal fecal calprotectin.  Persistent diarrhea reported [facility] clinic appointment 07/11/2024.  CT enterography showed no apparent neoterminal ileum inflammation Subsequently prescribed a course of rifaximin  -prior authorization approved 07/20/24, but she was still unable to take it because her cost would have been over thousand dollars   Discussed the use of AI scribe software for clinical note transcription with the patient, who gave verbal consent to proceed.  History of Present Illness [name] is a 68 year old female with Crohn's disease who presents with chronic diarrhea.  Chronic diarrhea - Duration of 6 to 8 months - Frequency of 3 to 4 episodes daily, primarily in the morning and around lunchtime - No bowel movements later in the day or after dinner - Stools are loose and watery, without form - Previously had one formed bowel movement daily - No blood in stools - Imodium used as needed, typically 2 tablets in the morning when planning to be out or attend functions - No recollection of any new medicines prior to the onset of diarrhea  Evaluation and treatment response - Stool test for C. difficile negative - Trial of metronidazole  for suspected small bowel bacterial overgrowth without improvement - Rifaximin  prescribed but not obtained due to cost despite insurance approval  Crohn's disease history and recent imaging - History of Crohn's disease - Previous colonoscopy showed ulceration at the surgical  anastomosis, no active inflammation in colon or terminal ileum - Recent CT enterography showed no inflammation in the terminal ileum - Gallbladder intact    ROS: Cardiovascular:  no chest  pain Respiratory: no dyspnea  The patient's Past Medical, Family and Social History were reviewed and are on file in the EMR. Past Medical History:  Diagnosis Date   Arthritis    Crohn disease (HCC) 07/04/2021   Crohn's disease (HCC)    03-26-15 Prednisone  sarted 2 weeks ago   Foot drop    right foot-wears brace.   Migraine    On prednisone  therapy    started 2 weeks ago   Pancreatitis    Shingles     Past Surgical History:  Procedure Laterality Date   BOWEL RESECTION     Crohn's disease   COLONOSCOPY     COLONOSCOPY WITH PROPOFOL  N/A 04/02/2015   Procedure: COLONOSCOPY WITH PROPOFOL ;  Surgeon: Gladis MARLA Louder, MD;  Location: [facility];  Service: Endoscopy;  Laterality: N/A;   hip replacement x 5      KNEE SURGERY     scopes   SHOULDER SURGERY     debridement     Objective:  Med list reviewed Current Medications[1]   Vital signs in last 24 hrs: Vitals:   08/16/24 1417  BP: 126/76  Pulse: 73  SpO2: 97%   Wt Readings from Last 3 Encounters:  08/16/24 161 lb (73 kg)  07/11/24 159 lb (72.1 kg)  05/17/24 157 lb (71.2 kg)    Physical Exam  Well-appearing Yeah Cardiac: Regular without appreciable murmur, bilateral pretibial edema Pulm: clear to auscultation bilaterally, normal RR and effort noted Abdomen: soft, no tenderness, with active bowel sounds. No guarding or palpable hepatosplenomegaly. Skin; warm and dry, no jaundice or rash   Labs:  Other data as noted above ___________________________________________ Radiologic studies:  EXAM: CT ABDOMEN AND PELVIS WITH CONTRAST (ENTEROGRAPHY) 07/18/2024 02:13:09 PM   TECHNIQUE: CT of the abdomen and pelvis was performed after the administration of 100 mL of iohexol  (OMNIPAQUE ) 300 MG/ML solution and negative oral contrast. Automated exposure control, iterative reconstruction, and/or weight based adjustment of the mA/kV was utilized to reduce the radiation dose to as low as reasonably achievable.    COMPARISON: CT 12/30/2021 100 cc omnipaque .   CLINICAL HISTORY: Chronic diarrhea. Crohn disease.   FINDINGS:   LOWER CHEST: No acute abnormality.   LIVER: The liver is unremarkable.   GALLBLADDER AND BILE DUCTS: Gallbladder is unremarkable. No biliary ductal dilatation.   SPLEEN: No acute abnormality.   PANCREAS: No acute abnormality.   ADRENAL GLANDS: No acute abnormality.   KIDNEYS, URETERS AND BLADDER: No stones in the kidneys or ureters. No hydronephrosis. No perinephric or periureteral stranding. Urinary bladder is unremarkable.   STOMACH AND BOWEL: The GI tract is distended by negative oral contrast. The stomach and duodenum are normal. Normal mucosal pattern of the jejunum and ileum. No small bowel stricturing or dilatation is evident. No abnormal enhancement. Postsurgical change consistent with partial right colectomy with an enteric colonic anastomosis. No inflammation or obstruction at the anastomosis. Transverse and descending colon are normal. The rectosigmoid colon is normal. No fistula or abscess. No evidence of active Crohn disease. No small bowel inflammation or stricturing. Postsurgical changes consistent with partial right hemicolectomy.   PERITONEUM AND RETROPERITONEUM: No ascites. No free air.   VASCULATURE: Aorta is normal in caliber.   LYMPH NODES: No lymphadenopathy.   REPRODUCTIVE ORGANS: Post hysterectomy.   BONES AND SOFT TISSUES: Bilateral  hip prosthetics generate streak artifact through the pelvis. No acute osseous abnormality. No focal soft tissue abnormality.   IMPRESSION: 1. No evidence of active Crohn disease. 2. Postsurgical changes consistent with partial right hemicolectomy without inflammation or obstruction at the anastomosis.   Electronically signed by: Norleen Boxer MD 07/22/2024 02:52 PM EST RP Workstation:  [id number] ____________________________________________ Other:   _____________________________________________   Encounter Diagnoses  Name Primary?   Crohn's disease of small intestine with intestinal obstruction (HCC) Yes   Chronic diarrhea     Assessment & Plan Chronic diarrhea Chronic diarrhea for 6-8 months, 3-4 times daily, loose stools. Negative C. difficile test. Metronidazole  ineffective. Rifaximin  not obtained. Differential still includes SIBO, bile acid diarrhea, microscopic colitis. CT enterography and calprotectin test show no active Crohn's or inflammation. Bile acid diarrhea unlikely. Microscopic colitis possible but unconfirmed. Imodium effective. - Continue Imodium as needed for symptom control. Had no improvement at all for metronidazole , making SIBO less likely.  Even if we did breath testing would not make the rifaximin  any more affordable, so she has decided not to spend that much money for it.  Despite her previous ileocecal resection, we will be unusual for bile acid diarrhea to occur this many years afterward, especially without having had a cholecystectomy.  Crohn's disease with anastomotic ulceration Crohn's with anastomotic ulceration.  Would not explain diarrhea no active inflammation in small bowel or colon. Current activity does not explain diarrhea. Rinvoq reportedly being considered for her arthritis  I offered a colonoscopy to evaluate for the possibility of microscopic colitis. At this point, Jaimya feels the diarrhea is manageable with Imodium and she will return to see me if that were to change.   If she decides to pursue the colonoscopy, she will contact us  and that can be arranged.  25 minutes were spent on this encounter (including chart review, history/exam, counseling/coordination of care, and documentation) > 50% of that time was spent on counseling and coordination of care.   Victory LITTIE Brand III     [1]  Current Outpatient Medications:     cyanocobalamin  (VITAMIN B12) 1000 MCG/ML injection, Inject 1 mL (1,000 mcg total) into the muscle every 30 (thirty) days., Disp: 6 mL, Rfl: 11   meclizine  (ANTIVERT ) 25 MG tablet, Take 1 tablet (25 mg total) by mouth 3 (three) times daily as needed for dizziness., Disp: 30 tablet, Rfl: 0   promethazine (PHENERGAN) 12.5 MG tablet, Take 12.5 mg by mouth every 6 (six) hours as needed for nausea or vomiting., Disp: , Rfl:    SYRINGE-NEEDLE, DISP, 3 ML (BD SAFETYGLIDE SYRINGE/NEEDLE) 25G X 1 3 ML MISC, Use for B12 injections, Disp: 100 each, Rfl: 11   topiramate (TOPAMAX) 200 MG tablet, Take 300 mg by mouth at bedtime., Disp: , Rfl:    rifaximin  (XIFAXAN ) 550 MG TABS tablet, Take 1 tablet (550 mg total) by mouth 3 (three) times daily. (Patient not taking: Reported on 08/16/2024), Disp: 42 tablet, Rfl: 0
